# Patient Record
Sex: Female | Born: 1975 | Race: White | Hispanic: No | Marital: Married | Smoking: Never smoker
Health system: Southern US, Community
[De-identification: ages and names within clinical notes are randomized; demographics above are authoritative.]

## PROBLEM LIST (undated history)

## (undated) DIAGNOSIS — R591 Generalized enlarged lymph nodes: Principal | ICD-10-CM

## (undated) DIAGNOSIS — Z9889 Other specified postprocedural states: Secondary | ICD-10-CM

## (undated) DIAGNOSIS — R112 Nausea with vomiting, unspecified: Secondary | ICD-10-CM

## (undated) DIAGNOSIS — N390 Urinary tract infection, site not specified: Secondary | ICD-10-CM

## (undated) DIAGNOSIS — N809 Endometriosis, unspecified: Secondary | ICD-10-CM

## (undated) DIAGNOSIS — K589 Irritable bowel syndrome without diarrhea: Secondary | ICD-10-CM

## (undated) DIAGNOSIS — E538 Deficiency of other specified B group vitamins: Secondary | ICD-10-CM

## (undated) DIAGNOSIS — N83201 Unspecified ovarian cyst, right side: Secondary | ICD-10-CM

## (undated) HISTORY — PX: ABDOMINAL HYSTERECTOMY: SHX81

## (undated) HISTORY — DX: Deficiency of other specified B group vitamins: E53.8

## (undated) HISTORY — DX: Endometriosis, unspecified: N80.9

## (undated) HISTORY — PX: LAPAROSCOPY: SHX197

## (undated) HISTORY — DX: Irritable bowel syndrome, unspecified: K58.9

## (undated) HISTORY — DX: Other disorders of bilirubin metabolism: E80.6

## (undated) HISTORY — DX: Generalized enlarged lymph nodes: R59.1

## (undated) HISTORY — DX: Unspecified ovarian cyst, right side: N83.201

## (undated) HISTORY — DX: Urinary tract infection, site not specified: N39.0

---

## 1997-10-21 ENCOUNTER — Ambulatory Visit (HOSPITAL_COMMUNITY): Admission: RE | Admit: 1997-10-21 | Discharge: 1997-10-22 | Payer: Self-pay | Admitting: Gynecology

## 1999-05-29 ENCOUNTER — Other Ambulatory Visit: Admission: RE | Admit: 1999-05-29 | Discharge: 1999-05-29 | Payer: Self-pay | Admitting: Obstetrics and Gynecology

## 2001-05-09 ENCOUNTER — Other Ambulatory Visit: Admission: RE | Admit: 2001-05-09 | Discharge: 2001-05-09 | Payer: Self-pay | Admitting: Obstetrics and Gynecology

## 2001-05-29 ENCOUNTER — Encounter: Payer: Self-pay | Admitting: Internal Medicine

## 2001-05-29 ENCOUNTER — Encounter: Admission: RE | Admit: 2001-05-29 | Discharge: 2001-05-29 | Payer: Self-pay | Admitting: Internal Medicine

## 2001-12-13 ENCOUNTER — Inpatient Hospital Stay (HOSPITAL_COMMUNITY): Admission: AD | Admit: 2001-12-13 | Discharge: 2001-12-13 | Payer: Self-pay | Admitting: Obstetrics & Gynecology

## 2002-06-14 ENCOUNTER — Inpatient Hospital Stay (HOSPITAL_COMMUNITY): Admission: AD | Admit: 2002-06-14 | Discharge: 2002-06-14 | Payer: Self-pay | Admitting: Obstetrics and Gynecology

## 2002-06-24 ENCOUNTER — Inpatient Hospital Stay (HOSPITAL_COMMUNITY): Admission: AD | Admit: 2002-06-24 | Discharge: 2002-06-24 | Payer: Self-pay | Admitting: Obstetrics & Gynecology

## 2002-06-26 ENCOUNTER — Inpatient Hospital Stay (HOSPITAL_COMMUNITY): Admission: AD | Admit: 2002-06-26 | Discharge: 2002-06-28 | Payer: Self-pay | Admitting: Obstetrics and Gynecology

## 2002-06-30 ENCOUNTER — Ambulatory Visit (HOSPITAL_COMMUNITY): Admission: RE | Admit: 2002-06-30 | Discharge: 2002-06-30 | Payer: Self-pay | Admitting: *Deleted

## 2003-08-25 ENCOUNTER — Other Ambulatory Visit: Admission: RE | Admit: 2003-08-25 | Discharge: 2003-08-25 | Payer: Self-pay | Admitting: Obstetrics and Gynecology

## 2004-07-04 ENCOUNTER — Encounter (INDEPENDENT_AMBULATORY_CARE_PROVIDER_SITE_OTHER): Payer: Self-pay | Admitting: *Deleted

## 2004-07-04 ENCOUNTER — Encounter: Admission: RE | Admit: 2004-07-04 | Discharge: 2004-07-04 | Payer: Self-pay | Admitting: Internal Medicine

## 2004-07-17 ENCOUNTER — Ambulatory Visit: Payer: Self-pay | Admitting: Gastroenterology

## 2009-01-27 DIAGNOSIS — K589 Irritable bowel syndrome without diarrhea: Secondary | ICD-10-CM | POA: Insufficient documentation

## 2009-01-27 DIAGNOSIS — N809 Endometriosis, unspecified: Secondary | ICD-10-CM | POA: Insufficient documentation

## 2009-01-27 DIAGNOSIS — R17 Unspecified jaundice: Secondary | ICD-10-CM | POA: Insufficient documentation

## 2009-02-02 ENCOUNTER — Ambulatory Visit: Payer: Self-pay | Admitting: Internal Medicine

## 2009-02-04 LAB — CONVERTED CEMR LAB: Tissue Transglutaminase Ab, IgA: 0.5 units (ref <7)

## 2010-05-18 ENCOUNTER — Ambulatory Visit (HOSPITAL_COMMUNITY): Admission: RE | Admit: 2010-05-18 | Discharge: 2010-05-18 | Payer: Self-pay | Admitting: Obstetrics and Gynecology

## 2010-10-12 NOTE — Assessment & Plan Note (Signed)
Summary: IBS/ STRONG FAN HX COL CA/FH   History of Present Illness Visit Type: Initial Visit Primary GI MD: Lina Sar MD Primary Nelva Hauk: Marisue Brooklyn MD Chief Complaint: IBS no current GI complaints, has strong family history of colon and stomach cancer History of Present Illness:   This is a 35 year old white female with a history of irritable bowel syndrome and significant endometriosis for which she is status post laparoscopic pelvic surgery in 1999 with a recurrence of endometriosis. Patient has been followed by Dr.Taavon. She describes crampy abdominal pain. There are certain food intolerances but no rectal bleeding and no family history of inflammatory bowel disease. She has a strong family history of colon and gastrointestinal cancer. Her paternal grandfather had colon cancer in his 79s. His son, the patient's father has had numerous colon polyps removed. He is a patient in our practice as well. Patient's maternal grandfather had gastric cancer. Patient had a normal abdominal ultrasound in 2005. Her total bilirubin was elevated at the time but the rest of the liver tests were normal. She has questionable lactose intolerance. Her irritable bowel syndrome is controlled by dietary modifications but she has breakthrough diarrhea postprandially. She takes over-the-counter medications to control her symptoms.   GI Review of Systems      Denies abdominal pain, acid reflux, belching, bloating, chest pain, dysphagia with liquids, dysphagia with solids, heartburn, loss of appetite, nausea, vomiting, vomiting blood, weight loss, and  weight gain.      Reports irritable bowel syndrome.     Denies anal fissure, black tarry stools, change in bowel habit, constipation, diarrhea, diverticulosis, fecal incontinence, heme positive stool, hemorrhoids, jaundice, light color stool, liver problems, rectal bleeding, and  rectal pain.    Current Medications (verified): 1)  Multivitamins   Tabs (Multiple  Vitamin) .Marland Kitchen.. 1 By Mouth Once Daily  Allergies (verified): 1)  ! Phenergan 2)  ! * Latex  Past History:  Past Medical History: Reviewed history from 01/27/2009 and no changes required. Current Problems:  Hx of HYPERBILIRUBINEMIA (ICD-782.4) ENDOMETRIOSIS (ICD-617.9) Family Hx of COLON CANCER (ICD-153.9) IRRITABLE BOWEL SYNDROME (ICD-564.1)    Past Surgical History: Reviewed history from 01/27/2009 and no changes required. Laproscopy for endometriosis-1999  Family History: Family History of Breast Cancer: Paternal Aunt Family History of Colon Cancer: Paternal Grandmother, Aunt Family History of Colon Polyps: Father Family History of Heart Disease: Maternal Grandfather Family History of Stomach Cancer:grandfather  Social History: Alcohol Use - yes-socially Illicit Drug Use - no Patient has never smoked.  Daily Caffeine Use 1 per day Patient gets regular exercise. Smoking Status:  never Does Patient Exercise:  yes  Review of Systems       The patient complains of menstrual pain and muscle pains/cramps.         Pertinent positive and negative review of systems were noted in the above HPI. All other ROS was otherwise negative.   Vital Signs:  Patient profile:   35 year old female Height:      65 inches Weight:      159.50 pounds BMI:     26.64 Pulse rate:   64 / minute Pulse rhythm:   regular BP sitting:   110 / 70  (right arm)  Vitals Entered By: Chales Abrahams CMA (Feb 02, 2009 9:20 AM)  Physical Exam  General:  Well developed, well nourished, no acute distress. Eyes:  PERRLA, no icterus. Mouth:  No deformity or lesions, dentition normal. Neck:  Supple; no masses or thyromegaly. Lungs:  Clear  throughout to auscultation. Heart:  Regular rate and rhythm; no murmurs, rubs or bruits. Abdomen:  soft abdomen with normal active bowel sounds and no distention. There are some stretch marks in the lower abdomen. Liver edge is at costal margin. Spleen is not  enlarged. Rectal:  Rectal tone is normal stool is hemoccult negative. Extremities:  No clubbing, cyanosis, edema or deformities noted. Neurologic:  Alert and oriented x4; grossly normal neurologically. Skin:  no rashes   Impression & Recommendations:  Problem # 1:  Hx of HYPERBILIRUBINEMIA (ICD-782.4) We will obtain a total bilirubin, direct and indirect. 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  Orders: T- * Misc. Laboratory test [phone])  Problem # 2:  ENDOMETRIOSIS (ICD-617.9) significant pelvic endometriosis may be affecting her bowel function. She says that Dr Billy Coast may have to do  another laparoscopic surgery to control her endometriosis.  Problem # 3:  Family Hx of COLON CANCER (ICD-153.9) Patient has colon cancer in an indirect relative. There were colon polyps in her father and gastric cancer in her grandfather. She should have a colonoscopy at age 40.  Problem # 4:  IRRITABLE BOWEL SYNDROME (ICD-564.1) I suggested using Levsin sublingually 0.125 mg p.r.n. cramps and following a high fiber diet.  Other Orders: T-Tissue Transglutamase Ab IgA [phone])  Patient Instructions: 1)  Levsin 0.125 mg p.r.n., # 30  2)  High-fiber diet. 3)  Lab work today including tissue transglutaminase and total bilirubin, direct and indirect bilirubin. 4)  Recall colonoscopy at age 61 or earlier if she develops specific symptoms. 5)  Copy sent to : Dr Billy Coast, Dr Wray Kearns Prescriptions: LEVSIN/SL 0.125 MG SUBL (HYOSCYAMINE SULFATE) Dissolve 1 tablet under tongue as needed for crampy abdominal pain.  #30 x 1   Entered by:   Hortense Ramal CMA   Authorized by:   Hart Carwin   Signed by:   Hortense Ramal CMA on 02/02/2009   Method used:   Electronically to        [id number] (retail)       [address]       [place], [place]  [address]       Ph: [phone] or [phone]       Fax: [phone]   RxID:   [id number]

## 2010-10-12 NOTE — Procedures (Signed)
Summary: Gastroenterology-Office Note  Gastroenterology-Office Note   Imported By: Hortense Ramal CMA 01/28/2009 17:44:28  _____________________________________________________________________  External Attachment:    Type:   Image     Comment:   External Document

## 2010-11-23 LAB — CBC
HCT: 41.5 % (ref 36.0–46.0)
Hemoglobin: 14 g/dL (ref 12.0–15.0)
MCH: 30.3 pg (ref 26.0–34.0)
MCHC: 33.8 g/dL (ref 30.0–36.0)
MCV: 89.4 fL (ref 78.0–100.0)
Platelets: 205 10*3/uL (ref 150–400)
RBC: 4.64 MIL/uL (ref 3.87–5.11)
RDW: 13 % (ref 11.5–15.5)
WBC: 5.9 10*3/uL (ref 4.0–10.5)

## 2010-11-23 LAB — HCG, SERUM, QUALITATIVE: Preg, Serum: NEGATIVE

## 2010-11-23 LAB — SURGICAL PCR SCREEN
MRSA, PCR: NEGATIVE
Staphylococcus aureus: NEGATIVE

## 2011-01-19 ENCOUNTER — Ambulatory Visit
Admission: RE | Admit: 2011-01-19 | Discharge: 2011-01-19 | Disposition: A | Discharge status: Home / Self Care | Payer: BC Managed Care – PPO | Source: Ambulatory Visit | Attending: Internal Medicine | Admitting: Internal Medicine

## 2011-01-19 ENCOUNTER — Other Ambulatory Visit: Payer: Self-pay | Admitting: Internal Medicine

## 2011-01-19 DIAGNOSIS — R221 Localized swelling, mass and lump, neck: Secondary | ICD-10-CM

## 2011-05-09 ENCOUNTER — Other Ambulatory Visit: Payer: Self-pay | Admitting: Internal Medicine

## 2011-05-09 DIAGNOSIS — IMO0002 Reserved for concepts with insufficient information to code with codable children: Secondary | ICD-10-CM

## 2011-05-10 ENCOUNTER — Ambulatory Visit
Admission: RE | Admit: 2011-05-10 | Discharge: 2011-05-10 | Disposition: A | Discharge status: Home / Self Care | Payer: BC Managed Care – PPO | Source: Ambulatory Visit | Attending: Internal Medicine | Admitting: Internal Medicine

## 2011-05-10 DIAGNOSIS — IMO0002 Reserved for concepts with insufficient information to code with codable children: Secondary | ICD-10-CM

## 2011-05-10 MED ORDER — IOHEXOL 300 MG/ML  SOLN
75.0000 mL | Freq: Once | INTRAMUSCULAR | Status: AC | PRN
  Administered 2011-05-10: 75 mL via INTRAVENOUS

## 2012-10-06 ENCOUNTER — Telehealth: Payer: Self-pay | Admitting: Internal Medicine

## 2012-10-06 ENCOUNTER — Ambulatory Visit (INDEPENDENT_AMBULATORY_CARE_PROVIDER_SITE_OTHER): Payer: BC Managed Care – PPO | Admitting: Gastroenterology

## 2012-10-06 ENCOUNTER — Encounter: Payer: Self-pay | Admitting: Gastroenterology

## 2012-10-06 ENCOUNTER — Other Ambulatory Visit (INDEPENDENT_AMBULATORY_CARE_PROVIDER_SITE_OTHER): Payer: BC Managed Care – PPO

## 2012-10-06 VITALS — BP 138/76 | HR 80 | Ht 64.5 in | Wt 150.0 lb

## 2012-10-06 DIAGNOSIS — R143 Flatulence: Secondary | ICD-10-CM

## 2012-10-06 DIAGNOSIS — R141 Gas pain: Secondary | ICD-10-CM

## 2012-10-06 DIAGNOSIS — K589 Irritable bowel syndrome without diarrhea: Secondary | ICD-10-CM

## 2012-10-06 DIAGNOSIS — R197 Diarrhea, unspecified: Secondary | ICD-10-CM

## 2012-10-06 DIAGNOSIS — R140 Abdominal distension (gaseous): Secondary | ICD-10-CM | POA: Insufficient documentation

## 2012-10-06 DIAGNOSIS — R109 Unspecified abdominal pain: Secondary | ICD-10-CM

## 2012-10-06 LAB — COMPREHENSIVE METABOLIC PANEL
ALT: 16 U/L (ref 0–35)
AST: 16 U/L (ref 0–37)
Albumin: 4.4 g/dL (ref 3.5–5.2)
Alkaline Phosphatase: 43 U/L (ref 39–117)
BUN: 17 mg/dL (ref 6–23)
CO2: 27 mEq/L (ref 19–32)
Calcium: 9.3 mg/dL (ref 8.4–10.5)
Chloride: 104 mEq/L (ref 96–112)
Creatinine, Ser: 0.7 mg/dL (ref 0.4–1.2)
GFR: 97.68 mL/min (ref >60.00)
Glucose, Bld: 87 mg/dL (ref 70–99)
Potassium: 4.4 mEq/L (ref 3.5–5.1)
Sodium: 138 mEq/L (ref 135–145)
Total Bilirubin: 2.2 mg/dL — ABNORMAL HIGH (ref 0.3–1.2)
Total Protein: 7.4 g/dL (ref 6.0–8.3)

## 2012-10-06 LAB — CBC
HCT: 43.1 % (ref 36.0–46.0)
Hemoglobin: 14.6 g/dL (ref 12.0–15.0)
MCHC: 33.9 g/dL (ref 30.0–36.0)
MCV: 88.7 fl (ref 78.0–100.0)
Platelets: 207 10*3/uL (ref 150.0–400.0)
RBC: 4.86 Mil/uL (ref 3.87–5.11)
RDW: 12.8 % (ref 11.5–14.6)
WBC: 7.3 10*3/uL (ref 4.5–10.5)

## 2012-10-06 LAB — TSH: TSH: 2.2 u[IU]/mL (ref 0.35–5.50)

## 2012-10-06 LAB — IGA: IgA: 290 mg/dL (ref 68–378)

## 2012-10-06 NOTE — Patient Instructions (Addendum)
Your physician has requested that you go to the basement for  lab work before leaving today.   You have been scheduled for an HIDA scan with [facility] at [facility] on 10/21/2012 @ 9:45; please arrive at 9:30am, report to Radiology.  Have nothing to eat or drink after midnight prior to your scan.  If you need to reschedule this appointment please call [phone]   Please follow up with Dr. Juanda Chance in 4-6 weeks

## 2012-10-06 NOTE — Progress Notes (Signed)
Reviewed, acute diarrhea superimposed on IBS. Could be bacterial overgrowth, gastro-enteritris. Agree with ultrasound. i will see her in 6 weeks

## 2012-10-06 NOTE — Progress Notes (Signed)
10/06/2012 Brenda Bennett [id number] Apr 18, 1976   HISTORY OF PRESENT ILLNESS:  Patient is a pleasant 37 year old female who presents to our office today with complaints of diarrhea, upper abdominal pain, nausea, bloating, and an episode of vomiting that has been present since eating pasta at a restaurant on Saturday night.  She was seen in 2005 and 2010 at this practice and diagnosed with IBS.  She did not want to take any medication, therefore, she has been managing her symptoms with diet for the last 20 years since her symptoms began.  She avoids spicy foods, lactose, and most recently has significantly decreased gluten intake.  Then Saturday night she went out and deviated from her normal diet.  Since then she has experienced 8 episodes of diarrhea, nausea, one episode of vomiting, bloating, and upper abdominal pain.  She called out of work today.  She looks well.  Says that the pain feels similar to the pain that she had previously with her menstrual cycles, but she just had her first cycle in two years after having an ablation.  She is unsure if the pain is related to GI issues or her GYN issues/endometriosis, but she is also seeing her GYN next week.  Otherwise symptoms had been doing well for quite some time.  No dark or bloody stool, decreased appetite, or unintentional weight loss.  Her son was recently diagnosed with DM type 1 and celiac disease during screening (he is not symptomatic).     Past Medical History  Diagnosis Date  . IBS (irritable bowel syndrome)   . UTI (lower urinary tract infection)    Past Surgical History  Procedure Date  . Laparoscopy 2/99, 12/12    AND ABLASION    reports that she has never smoked. She has never used smokeless tobacco. She reports that she drinks alcohol. She reports that she does not use illicit drugs. family history includes Breast cancer in her paternal aunt; Celiac disease in her brother; Colon cancer (age of onset:0) in her paternal  grandmother; Colon polyps in her father; and Diabetes in her son. Allergies  Allergen Reactions  . Latex   . Promethazine Hcl       Outpatient Encounter Prescriptions as of 10/06/2012  Medication Sig Dispense Refill  . Calcium Glycerophosphate (PRELIEF) 340 (65-50) MG (CA-P) TABS Take 1 capsule by mouth as needed.         REVIEW OF SYSTEMS  : All other systems reviewed and negative except where noted in the History of Present Illness.   PHYSICAL EXAM: BP 138/76  Pulse 80  Ht 5' 4.5" (1.638 m)  Wt 150 lb (68.04 kg)  BMI 25.35 kg/m2 General: Well developed white female in no acute distress Head: Normocephalic and atraumatic Eyes:  sclerae anicteric,conjunctive pink. Ears: Normal auditory acuity Lungs: Clear throughout to auscultation Heart: Regular rate and rhythm Abdomen: Soft, non-distended. No masses or hepatomegaly noted. Normal bowel sounds.  Diffuse TTP > in the upper abdomen. Musculoskeletal: Symmetrical with no gross deformities  Skin: No lesions on visible extremities Extremities: No edema  Neurological: Alert oriented x 4, grossly nonfocal Psychological:  Alert and cooperative. Normal mood and affect  ASSESSMENT AND PLAN: -Long-standing complaints of diarrhea and abdominal pain with bloating that has been diagnosed as IBS in the past, usually controlled with diet.  "Flare" of symptoms with diarrhea, upper abdominal pain, nausea, bloating, and an episode of vomiting since eating pasta on Saturday.  Her son was recently diagnosed with celiac disease.  She may even have a gluten "sensitivity" vs celiac vs gallbladder dysfunction vs IBS (likely has IBS but may have something else overlapping).  *Will check TTG and IgA for celiac as well as TSH, CBC, and CMP. *Will rule out gallbladder etiology with ultrasound/HIDA scan. *Celiac dietary measures as she had been previously. *Does not want any medication for symptomatic treatment. *Offered stool studies but she declined at  this time; doubt an infectious source anyway. *Follow-up 4-6 weeks.

## 2012-10-06 NOTE — Telephone Encounter (Signed)
Patient has diarrhea and abdominal pain x 48 hours. Denies fever or bleeding. Hx of IBS but has been controlled by diet. States her son was recently diagnosed with celiac and she is concerned she may have it also. Scheduled with Doug Sou, PA today at 1:30 pm.

## 2012-10-21 ENCOUNTER — Encounter (HOSPITAL_COMMUNITY): Payer: Self-pay

## 2012-10-21 ENCOUNTER — Encounter (HOSPITAL_COMMUNITY)
Admission: RE | Admit: 2012-10-21 | Discharge: 2012-10-21 | Disposition: A | Discharge status: Home / Self Care | Payer: BC Managed Care – PPO | Source: Ambulatory Visit | Attending: Gastroenterology | Admitting: Gastroenterology

## 2012-10-21 DIAGNOSIS — R197 Diarrhea, unspecified: Secondary | ICD-10-CM | POA: Insufficient documentation

## 2012-10-21 DIAGNOSIS — R109 Unspecified abdominal pain: Secondary | ICD-10-CM | POA: Insufficient documentation

## 2012-10-21 MED ORDER — TECHNETIUM TC 99M MEBROFENIN IV KIT
4.9000 | PACK | Freq: Once | INTRAVENOUS | Status: AC | PRN
  Administered 2012-10-21: 4.9 via INTRAVENOUS

## 2012-10-27 ENCOUNTER — Encounter: Payer: Self-pay | Admitting: *Deleted

## 2012-10-30 ENCOUNTER — Encounter (HOSPITAL_COMMUNITY): Payer: Self-pay | Admitting: Pharmacist

## 2012-11-04 ENCOUNTER — Encounter (HOSPITAL_COMMUNITY)
Admission: RE | Admit: 2012-11-04 | Discharge: 2012-11-04 | Disposition: A | Discharge status: Home / Self Care | Payer: BC Managed Care – PPO | Source: Ambulatory Visit | Attending: Obstetrics and Gynecology | Admitting: Obstetrics and Gynecology

## 2012-11-04 ENCOUNTER — Encounter (HOSPITAL_COMMUNITY): Payer: Self-pay

## 2012-11-04 HISTORY — DX: Other specified postprocedural states: Z98.890

## 2012-11-04 HISTORY — DX: Nausea with vomiting, unspecified: R11.2

## 2012-11-04 LAB — CBC
HCT: 40.4 % (ref 36.0–46.0)
Hemoglobin: 13.6 g/dL (ref 12.0–15.0)
MCH: 29.6 pg (ref 26.0–34.0)
MCHC: 33.7 g/dL (ref 30.0–36.0)
MCV: 88 fL (ref 78.0–100.0)
Platelets: 205 10*3/uL (ref 150–400)
RBC: 4.59 MIL/uL (ref 3.87–5.11)
RDW: 12.6 % (ref 11.5–15.5)
WBC: 4.6 10*3/uL (ref 4.0–10.5)

## 2012-11-04 LAB — SURGICAL PCR SCREEN
MRSA, PCR: NEGATIVE
Staphylococcus aureus: NEGATIVE

## 2012-11-04 NOTE — Patient Instructions (Addendum)
20 NADIA VIAR  11/04/2012   Your procedure is scheduled on:  11/10/12  Enter through the Main Entrance of [facility] at 1130 AM.  Pick up the phone at the desk and dial [phone].   Call this number if you have problems the morning of surgery: [phone]   Remember:   Do not eat food:After Midnight.  Do not drink clear liquids: 6 Hours before arrival.  Take these medicines the morning of surgery with A SIP OF WATER: NA   Do not wear jewelry, make-up or nail polish.  Do not wear lotions, powders, or perfumes. You may wear deodorant.  Do not shave 48 hours prior to surgery.  Do not bring valuables to the hospital.  Contacts, dentures or bridgework may not be worn into surgery.  Leave suitcase in the car. After surgery it may be brought to your room.  For patients admitted to the hospital, checkout time is 11:00 AM the day of discharge.   Patients discharged the day of surgery will not be allowed to drive home.  Name and phone number of your driver: NA  Special Instructions: Shower using CHG 2 nights before surgery and the night before surgery.  If you shower the day of surgery use CHG.  Use special wash - you have one bottle of CHG for all showers.  You should use approximately 1/3 of the bottle for each shower.   Please read over the following fact sheets that you were given: MRSA Information

## 2012-11-06 ENCOUNTER — Other Ambulatory Visit: Payer: Self-pay | Admitting: Obstetrics and Gynecology

## 2012-11-10 ENCOUNTER — Encounter (HOSPITAL_COMMUNITY): Payer: Self-pay | Admitting: Anesthesiology

## 2012-11-10 ENCOUNTER — Encounter (HOSPITAL_COMMUNITY)
Admission: RE | Disposition: A | Discharge status: Home / Self Care | Payer: Self-pay | Source: Ambulatory Visit | Attending: Obstetrics and Gynecology

## 2012-11-10 ENCOUNTER — Ambulatory Visit (HOSPITAL_COMMUNITY)
Admission: RE | Admit: 2012-11-10 | Discharge: 2012-11-11 | Disposition: A | Discharge status: Home / Self Care | Payer: BC Managed Care – PPO | Source: Ambulatory Visit | Attending: Obstetrics and Gynecology | Admitting: Obstetrics and Gynecology

## 2012-11-10 ENCOUNTER — Ambulatory Visit (HOSPITAL_COMMUNITY): Payer: BC Managed Care – PPO | Admitting: Anesthesiology

## 2012-11-10 DIAGNOSIS — N80109 Endometriosis of ovary, unspecified side, unspecified depth: Secondary | ICD-10-CM | POA: Insufficient documentation

## 2012-11-10 DIAGNOSIS — D252 Subserosal leiomyoma of uterus: Secondary | ICD-10-CM | POA: Insufficient documentation

## 2012-11-10 DIAGNOSIS — N803 Endometriosis of pelvic peritoneum, unspecified: Secondary | ICD-10-CM | POA: Insufficient documentation

## 2012-11-10 DIAGNOSIS — D251 Intramural leiomyoma of uterus: Secondary | ICD-10-CM | POA: Insufficient documentation

## 2012-11-10 DIAGNOSIS — N801 Endometriosis of ovary: Secondary | ICD-10-CM | POA: Insufficient documentation

## 2012-11-10 DIAGNOSIS — N946 Dysmenorrhea, unspecified: Secondary | ICD-10-CM | POA: Insufficient documentation

## 2012-11-10 DIAGNOSIS — N809 Endometriosis, unspecified: Secondary | ICD-10-CM | POA: Diagnosis present

## 2012-11-10 DIAGNOSIS — N8000 Endometriosis of the uterus, unspecified: Secondary | ICD-10-CM | POA: Insufficient documentation

## 2012-11-10 DIAGNOSIS — N815 Vaginal enterocele: Secondary | ICD-10-CM | POA: Insufficient documentation

## 2012-11-10 DIAGNOSIS — Z8742 Personal history of other diseases of the female genital tract: Secondary | ICD-10-CM

## 2012-11-10 DIAGNOSIS — N949 Unspecified condition associated with female genital organs and menstrual cycle: Secondary | ICD-10-CM | POA: Insufficient documentation

## 2012-11-10 HISTORY — PX: ROBOTIC ASSISTED TOTAL HYSTERECTOMY: SHX6085

## 2012-11-10 HISTORY — PX: BILATERAL SALPINGECTOMY: SHX5743

## 2012-11-10 LAB — CBC
HCT: 38.8 % (ref 36.0–46.0)
Hemoglobin: 13.4 g/dL (ref 12.0–15.0)
MCH: 30.4 pg (ref 26.0–34.0)
MCHC: 34.5 g/dL (ref 30.0–36.0)
MCV: 88 fL (ref 78.0–100.0)
Platelets: 150 10*3/uL (ref 150–400)
RBC: 4.41 MIL/uL (ref 3.87–5.11)
RDW: 12.5 % (ref 11.5–15.5)
WBC: 10 10*3/uL (ref 4.0–10.5)

## 2012-11-10 LAB — COMPREHENSIVE METABOLIC PANEL
ALT: 17 U/L (ref 0–35)
AST: 19 U/L (ref 0–37)
Albumin: 4.5 g/dL (ref 3.5–5.2)
Alkaline Phosphatase: 43 U/L (ref 39–117)
BUN: 12 mg/dL (ref 6–23)
CO2: 25 mEq/L (ref 19–32)
Calcium: 9.7 mg/dL (ref 8.4–10.5)
Chloride: 100 mEq/L (ref 96–112)
Creatinine, Ser: 0.83 mg/dL (ref 0.50–1.10)
GFR calc Af Amer: >90 mL/min (ref >90)
GFR calc non Af Amer: 88 mL/min — ABNORMAL LOW (ref >90)
Glucose, Bld: 94 mg/dL (ref 70–99)
Potassium: 4.2 mEq/L (ref 3.5–5.1)
Sodium: 138 mEq/L (ref 135–145)
Total Bilirubin: 3.8 mg/dL — ABNORMAL HIGH (ref 0.3–1.2)
Total Protein: 7.8 g/dL (ref 6.0–8.3)

## 2012-11-10 LAB — BASIC METABOLIC PANEL
BUN: 13 mg/dL (ref 6–23)
CO2: 24 mEq/L (ref 19–32)
Calcium: 8.8 mg/dL (ref 8.4–10.5)
Chloride: 100 mEq/L (ref 96–112)
Creatinine, Ser: 0.77 mg/dL (ref 0.50–1.10)
GFR calc Af Amer: >90 mL/min (ref >90)
GFR calc non Af Amer: >90 mL/min (ref >90)
Glucose, Bld: 116 mg/dL — ABNORMAL HIGH (ref 70–99)
Potassium: 3.9 mEq/L (ref 3.5–5.1)
Sodium: 135 mEq/L (ref 135–145)

## 2012-11-10 LAB — HCG, SERUM, QUALITATIVE: Preg, Serum: NEGATIVE

## 2012-11-10 SURGERY — ROBOTIC ASSISTED TOTAL HYSTERECTOMY
Anesthesia: General

## 2012-11-10 MED ORDER — OXYCODONE-ACETAMINOPHEN 5-325 MG PO TABS
1.0000 | ORAL_TABLET | ORAL | Status: DC | PRN
  Administered 2012-11-10 – 2012-11-11 (×3): 1 via ORAL
  Filled 2012-11-10 (×3): qty 1

## 2012-11-10 MED ORDER — NEOSTIGMINE METHYLSULFATE 1 MG/ML IJ SOLN
INTRAMUSCULAR | Status: DC | PRN
  Administered 2012-11-10: 3 mg via INTRAVENOUS

## 2012-11-10 MED ORDER — GLYCOPYRROLATE 0.2 MG/ML IJ SOLN
INTRAMUSCULAR | Status: AC
  Filled 2012-11-10: qty 2

## 2012-11-10 MED ORDER — ZOLPIDEM TARTRATE 5 MG PO TABS: 5.0000 mg | ORAL_TABLET | Freq: Every evening | ORAL | Status: DC | PRN

## 2012-11-10 MED ORDER — FENTANYL CITRATE 0.05 MG/ML IJ SOLN
25.0000 ug | INTRAMUSCULAR | Status: DC | PRN
  Administered 2012-11-10 (×2): 50 ug via INTRAVENOUS

## 2012-11-10 MED ORDER — LACTATED RINGERS IR SOLN
Status: DC | PRN
  Administered 2012-11-10: 1

## 2012-11-10 MED ORDER — KETOROLAC TROMETHAMINE 30 MG/ML IJ SOLN
INTRAMUSCULAR | Status: AC
  Filled 2012-11-10: qty 1

## 2012-11-10 MED ORDER — NEOSTIGMINE METHYLSULFATE 1 MG/ML IJ SOLN
INTRAMUSCULAR | Status: AC
  Filled 2012-11-10: qty 1

## 2012-11-10 MED ORDER — MIDAZOLAM HCL 5 MG/5ML IJ SOLN
INTRAMUSCULAR | Status: DC | PRN
  Administered 2012-11-10: 2 mg via INTRAVENOUS

## 2012-11-10 MED ORDER — PROPOFOL 10 MG/ML IV EMUL
INTRAVENOUS | Status: DC | PRN
  Administered 2012-11-10: 180 mg via INTRAVENOUS

## 2012-11-10 MED ORDER — FENTANYL CITRATE 0.05 MG/ML IJ SOLN
INTRAMUSCULAR | Status: AC
  Filled 2012-11-10: qty 5

## 2012-11-10 MED ORDER — GLYCOPYRROLATE 0.2 MG/ML IJ SOLN
INTRAMUSCULAR | Status: AC
  Filled 2012-11-10: qty 1

## 2012-11-10 MED ORDER — ACETAMINOPHEN 10 MG/ML IV SOLN
INTRAVENOUS | Status: AC
  Administered 2012-11-10: 1000 mg via INTRAVENOUS
  Filled 2012-11-10: qty 100

## 2012-11-10 MED ORDER — ROCURONIUM BROMIDE 100 MG/10ML IV SOLN
INTRAVENOUS | Status: DC | PRN
  Administered 2012-11-10: 5 mg via INTRAVENOUS
  Administered 2012-11-10: 45 mg via INTRAVENOUS
  Administered 2012-11-10: 5 mg via INTRAVENOUS

## 2012-11-10 MED ORDER — LIDOCAINE HCL (CARDIAC) 20 MG/ML IV SOLN
INTRAVENOUS | Status: DC | PRN
  Administered 2012-11-10: 50 mg via INTRAVENOUS

## 2012-11-10 MED ORDER — GLYCOPYRROLATE 0.2 MG/ML IJ SOLN
INTRAMUSCULAR | Status: DC | PRN
  Administered 2012-11-10: 0.1 mg via INTRAVENOUS
  Administered 2012-11-10: 0.4 mg via INTRAVENOUS
  Administered 2012-11-10: 0.1 mg via INTRAVENOUS

## 2012-11-10 MED ORDER — ONDANSETRON HCL 4 MG/2ML IJ SOLN
INTRAMUSCULAR | Status: DC | PRN
  Administered 2012-11-10: 4 mg via INTRAVENOUS

## 2012-11-10 MED ORDER — MEPERIDINE HCL 25 MG/ML IJ SOLN: 6.2500 mg | INTRAMUSCULAR | Status: DC | PRN

## 2012-11-10 MED ORDER — KETOROLAC TROMETHAMINE 30 MG/ML IJ SOLN
INTRAMUSCULAR | Status: DC | PRN
  Administered 2012-11-10: 30 mg via INTRAVENOUS

## 2012-11-10 MED ORDER — LIDOCAINE HCL (CARDIAC) 20 MG/ML IV SOLN
INTRAVENOUS | Status: AC
  Filled 2012-11-10: qty 5

## 2012-11-10 MED ORDER — CEFAZOLIN SODIUM-DEXTROSE 2-3 GM-% IV SOLR
2.0000 g | INTRAVENOUS | Status: AC
  Administered 2012-11-10: 2 g via INTRAVENOUS

## 2012-11-10 MED ORDER — DEXAMETHASONE SODIUM PHOSPHATE 10 MG/ML IJ SOLN
INTRAMUSCULAR | Status: AC
  Filled 2012-11-10: qty 1

## 2012-11-10 MED ORDER — SCOPOLAMINE 1 MG/3DAYS TD PT72
MEDICATED_PATCH | TRANSDERMAL | Status: AC
  Administered 2012-11-10: 1.5 mg via TRANSDERMAL
  Filled 2012-11-10: qty 1

## 2012-11-10 MED ORDER — ROCURONIUM BROMIDE 50 MG/5ML IV SOLN
INTRAVENOUS | Status: AC
  Filled 2012-11-10: qty 1

## 2012-11-10 MED ORDER — OXYCODONE-ACETAMINOPHEN 5-325 MG PO TABS: 1.0000 | ORAL_TABLET | ORAL | Status: DC | PRN

## 2012-11-10 MED ORDER — LACTATED RINGERS IV SOLN: INTRAVENOUS | Status: DC

## 2012-11-10 MED ORDER — TRAMADOL HCL 50 MG PO TABS: 50.0000 mg | ORAL_TABLET | Freq: Four times a day (QID) | ORAL | Status: DC | PRN

## 2012-11-10 MED ORDER — ARTIFICIAL TEARS OP OINT
TOPICAL_OINTMENT | OPHTHALMIC | Status: AC
  Filled 2012-11-10: qty 3.5

## 2012-11-10 MED ORDER — ONDANSETRON HCL 4 MG/2ML IJ SOLN
INTRAMUSCULAR | Status: AC
  Filled 2012-11-10: qty 2

## 2012-11-10 MED ORDER — LACTATED RINGERS IV SOLN
INTRAVENOUS | Status: DC
  Administered 2012-11-10 (×4): via INTRAVENOUS

## 2012-11-10 MED ORDER — ROPIVACAINE HCL 5 MG/ML IJ SOLN
INTRAMUSCULAR | Status: DC | PRN
  Administered 2012-11-10: 30 mL via EPIDURAL

## 2012-11-10 MED ORDER — SCOPOLAMINE 1 MG/3DAYS TD PT72: 1.0000 | MEDICATED_PATCH | TRANSDERMAL | Status: DC

## 2012-11-10 MED ORDER — DEXAMETHASONE SODIUM PHOSPHATE 10 MG/ML IJ SOLN
INTRAMUSCULAR | Status: DC | PRN
  Administered 2012-11-10: 10 mg via INTRAVENOUS

## 2012-11-10 MED ORDER — MIDAZOLAM HCL 2 MG/2ML IJ SOLN
INTRAMUSCULAR | Status: AC
  Filled 2012-11-10: qty 2

## 2012-11-10 MED ORDER — METOCLOPRAMIDE HCL 5 MG/ML IJ SOLN: 10.0000 mg | Freq: Once | INTRAMUSCULAR | Status: DC | PRN

## 2012-11-10 MED ORDER — PROPOFOL 10 MG/ML IV EMUL
INTRAVENOUS | Status: AC
  Filled 2012-11-10: qty 20

## 2012-11-10 MED ORDER — FENTANYL CITRATE 0.05 MG/ML IJ SOLN
INTRAMUSCULAR | Status: AC
  Administered 2012-11-10: 50 ug via INTRAVENOUS
  Filled 2012-11-10: qty 2

## 2012-11-10 MED ORDER — SCOPOLAMINE 1 MG/3DAYS TD PT72
1.0000 | MEDICATED_PATCH | TRANSDERMAL | Status: DC
Start: 2012-11-10 — End: 2012-11-10

## 2012-11-10 MED ORDER — FENTANYL CITRATE 0.05 MG/ML IJ SOLN
INTRAMUSCULAR | Status: AC
  Filled 2012-11-10: qty 2

## 2012-11-10 MED ORDER — ROPIVACAINE HCL 5 MG/ML IJ SOLN
INTRAMUSCULAR | Status: AC
  Filled 2012-11-10: qty 60

## 2012-11-10 MED ORDER — FENTANYL CITRATE 0.05 MG/ML IJ SOLN
INTRAMUSCULAR | Status: DC | PRN
  Administered 2012-11-10 (×2): 50 ug via INTRAVENOUS
  Administered 2012-11-10: 100 ug via INTRAVENOUS

## 2012-11-10 SURGICAL SUPPLY — 68 items
ADH SKN CLS APL DERMABOND .7 (GAUZE/BANDAGES/DRESSINGS) ×2
BAG URINE DRAINAGE (UROLOGICAL SUPPLIES) ×3 IMPLANT
BARRIER ADHS 3X4 INTERCEED (GAUZE/BANDAGES/DRESSINGS) ×3 IMPLANT
BRR ADH 4X3 ABS CNTRL BYND (GAUZE/BANDAGES/DRESSINGS) ×2
CATH FOLEY 3WAY  5CC 16FR (CATHETERS) ×2
CATH FOLEY 3WAY 5CC 16FR (CATHETERS) ×2 IMPLANT
CHLORAPREP W/TINT 26ML (MISCELLANEOUS) ×3 IMPLANT
CLOTH BEACON ORANGE TIMEOUT ST (SAFETY) ×3 IMPLANT
CONT PATH 16OZ SNAP LID 3702 (MISCELLANEOUS) ×3 IMPLANT
COVER MAYO STAND STRL (DRAPES) ×3 IMPLANT
COVER TABLE BACK 60X90 (DRAPES) ×6 IMPLANT
COVER TIP SHEARS 8 DVNC (MISCELLANEOUS) ×2 IMPLANT
COVER TIP SHEARS 8MM DA VINCI (MISCELLANEOUS) ×1
DECANTER SPIKE VIAL GLASS SM (MISCELLANEOUS) ×3 IMPLANT
DERMABOND ADVANCED (GAUZE/BANDAGES/DRESSINGS) ×1
DERMABOND ADVANCED .7 DNX12 (GAUZE/BANDAGES/DRESSINGS) ×2 IMPLANT
DRAPE HUG U DISPOSABLE (DRAPE) ×3 IMPLANT
DRAPE LG THREE QUARTER DISP (DRAPES) ×6 IMPLANT
DRAPE WARM FLUID 44X44 (DRAPE) ×3 IMPLANT
ELECT REM PT RETURN 9FT ADLT (ELECTROSURGICAL) ×3
ELECTRODE REM PT RTRN 9FT ADLT (ELECTROSURGICAL) ×2 IMPLANT
EVACUATOR SMOKE 8.L (FILTER) ×3 IMPLANT
GAUZE VASELINE 3X9 (GAUZE/BANDAGES/DRESSINGS) IMPLANT
GLOVE BIO SURGEON STRL SZ 6.5 (GLOVE) ×1 IMPLANT
GLOVE BIO SURGEON STRL SZ7.5 (GLOVE) ×7 IMPLANT
GLOVE INDICATOR 7.0 STRL GRN (GLOVE) ×1 IMPLANT
GLOVE INDICATOR 7.5 STRL GRN (GLOVE) ×6 IMPLANT
GOWN STRL REIN XL XLG (GOWN DISPOSABLE) ×18 IMPLANT
GYRUS RUMI II 2.5CM BLUE (DISPOSABLE)
GYRUS RUMI II 3.5CM BLUE (DISPOSABLE) ×3
GYRUS RUMI II 4.0CM BLUE (DISPOSABLE)
HEMOSTAT SURGICEL 2X14 (HEMOSTASIS) ×1 IMPLANT
KIT ACCESSORY DA VINCI DISP (KITS) ×1
KIT ACCESSORY DVNC DISP (KITS) ×2 IMPLANT
LEGGING LITHOTOMY PAIR STRL (DRAPES) ×3 IMPLANT
NEEDLE INSUFFLATION 120MM (ENDOMECHANICALS) ×3 IMPLANT
PACK LAVH (CUSTOM PROCEDURE TRAY) ×3 IMPLANT
PAD PREP 24X48 CUFFED NSTRL (MISCELLANEOUS) ×6 IMPLANT
PLUG CATH AND CAP STER (CATHETERS) ×3 IMPLANT
PROTECTOR NERVE ULNAR (MISCELLANEOUS) ×6 IMPLANT
RUMI II 3.0CM BLUE KOH-EFFICIE (DISPOSABLE) IMPLANT
RUMI II GYRUS 2.5CM BLUE (DISPOSABLE) IMPLANT
RUMI II GYRUS 3.5CM BLUE (DISPOSABLE) IMPLANT
RUMI II GYRUS 4.0CM BLUE (DISPOSABLE) IMPLANT
SET CYSTO W/LG BORE CLAMP LF (SET/KITS/TRAYS/PACK) IMPLANT
SET IRRIG TUBING LAPAROSCOPIC (IRRIGATION / IRRIGATOR) ×3 IMPLANT
SOLUTION ELECTROLUBE (MISCELLANEOUS) ×3 IMPLANT
SUT VIC AB 0 CT1 27 (SUTURE) ×6
SUT VIC AB 0 CT1 27XBRD ANBCTR (SUTURE) ×4 IMPLANT
SUT VIC AB 0 CT1 27XBRD ANTBC (SUTURE) IMPLANT
SUT VICRYL 0 UR6 27IN ABS (SUTURE) ×3 IMPLANT
SUT VICRYL RAPIDE 4/0 PS 2 (SUTURE) ×6 IMPLANT
SUT VLOC 180 0 9IN  GS21 (SUTURE) ×1
SUT VLOC 180 0 9IN GS21 (SUTURE) IMPLANT
SYR 50ML LL SCALE MARK (SYRINGE) ×3 IMPLANT
SYRINGE 10CC LL (SYRINGE) ×3 IMPLANT
SYSTEM CONVERTIBLE TROCAR (TROCAR) IMPLANT
TIP UTERINE 6.7X8CM BLUE DISP (MISCELLANEOUS) ×1 IMPLANT
TOWEL OR 17X24 6PK STRL BLUE (TOWEL DISPOSABLE) ×9 IMPLANT
TROCAR BLADELESS OPT 12M 100M (ENDOMECHANICALS) IMPLANT
TROCAR DILATING TIP 12MM 150MM (ENDOMECHANICALS) ×3 IMPLANT
TROCAR DISP BLADELESS 8 DVNC (TROCAR) ×2 IMPLANT
TROCAR DISP BLADELESS 8MM (TROCAR) ×1
TROCAR XCEL 12X100 BLDLESS (ENDOMECHANICALS) IMPLANT
TROCAR XCEL NON-BLD 5MMX100MML (ENDOMECHANICALS) ×3 IMPLANT
TUBING FILTER THERMOFLATOR (ELECTROSURGICAL) ×3 IMPLANT
WARMER LAPAROSCOPE (MISCELLANEOUS) ×3 IMPLANT
WATER STERILE IRR 1000ML POUR (IV SOLUTION) ×9 IMPLANT

## 2012-11-10 NOTE — Transfer of Care (Signed)
Immediate Anesthesia Transfer of Care Note  Patient: Brenda Bennett  Procedure(s) Performed: Procedure(s) with comments: ROBOTIC ASSISTED TOTAL HYSTERECTOMY (N/A) - 3 hrs. BILATERAL SALPINGECTOMY (Bilateral)  Patient Location: PACU  Anesthesia Type:General  Level of Consciousness: awake  Airway & Oxygen Therapy: Patient Spontanous Breathing  Post-op Assessment: Report given to PACU RN  Post vital signs: stable  Filed Vitals:   11/10/12 1117  BP: 136/86  Pulse: 59  Temp: 36.7 C  Resp: 18    Complications: No apparent anesthesia complications

## 2012-11-10 NOTE — Op Note (Signed)
11/10/2012  3:43 PM  PATIENT:  Brenda Bennett  38 y.o. female  PRE-OPERATIVE DIAGNOSIS:  Endometriosis  58571  POST-OPERATIVE DIAGNOSIS:  Endometriosis  58571 Pelvic adhesions Enterocele Left Endometrioma  PROCEDURE:  Procedure(s): ROBOTIC ASSISTED TOTAL HYSTERECTOMY BILATERAL SALPINGECTOMY Excision of left Endometrioma Lysis of Adhesions Mccall Cul-de-plasty Ablation of Endometriosis  SURGEON:  Surgeon(s): Lenoard Aden, MD Alphonsus Sias. Ernestina Penna, MD  ASSISTANTSErnestina Penna   ANESTHESIA:   local and general  ESTIMATED BLOOD LOSS: * No blood loss amount entered *   DRAINS: Urinary Catheter (Foley)   LOCAL MEDICATIONS USED:  MARCAINE     SPECIMEN:  Source of Specimen:  uterus and cervix and bilateral tubes  DISPOSITION OF SPECIMEN:  PATHOLOGY  COUNTS:  YES  DICTATION #: [id number]  PLAN OF CARE: DC home  PATIENT DISPOSITION:  PACU - hemodynamically stable.

## 2012-11-10 NOTE — Anesthesia Preprocedure Evaluation (Signed)
Anesthesia Evaluation  Patient identified by MRN, date of birth, ID band Patient awake    Reviewed: Allergy & Precautions, H&P , NPO status , Patient's Chart, lab work & pertinent test results  History of Anesthesia Complications (+) PONV  Airway Mallampati: II TM Distance: >3 FB     Dental no notable dental hx. (+) Teeth Intact   Pulmonary neg pulmonary ROS,  breath sounds clear to auscultation  Pulmonary exam normal       Cardiovascular negative cardio ROS  Rhythm:Regular Rate:Normal     Neuro/Psych negative neurological ROS  negative psych ROS   GI/Hepatic negative GI ROS, Neg liver ROS,   Endo/Other  negative endocrine ROS  Renal/GU negative Renal ROS  negative genitourinary   Musculoskeletal negative musculoskeletal ROS (+)   Abdominal Normal abdominal exam  (+)   Peds  Hematology negative hematology ROS (+)   Anesthesia Other Findings   Reproductive/Obstetrics Endometriosis  Pelvic Pain                           Anesthesia Physical Anesthesia Plan  ASA: I  Anesthesia Plan: General   Post-op Pain Management:    Induction: Intravenous  Airway Management Planned: Oral ETT  Additional Equipment:   Intra-op Plan:   Post-operative Plan: Extubation in OR  Informed Consent: I have reviewed the patients History and Physical, chart, labs and discussed the procedure including the risks, benefits and alternatives for the proposed anesthesia with the patient or authorized representative who has indicated his/her understanding and acceptance.   Dental advisory given  Plan Discussed with: CRNA, Anesthesiologist and Surgeon  Anesthesia Plan Comments:         Anesthesia Quick Evaluation

## 2012-11-10 NOTE — Progress Notes (Signed)
Patient ID: Brenda Bennett, female   DOB: 22-Jun-1976, 37 y.o.   MRN: [id number] Patient seen and examined. Consent witnessed and signed. No changes noted. Update completed.

## 2012-11-10 NOTE — Anesthesia Postprocedure Evaluation (Signed)
Anesthesia Post Note  Patient: Brenda Bennett  Procedure(s) Performed: Procedure(s) (LRB): ROBOTIC ASSISTED TOTAL HYSTERECTOMY (N/A) BILATERAL SALPINGECTOMY (Bilateral)  Anesthesia type: General  Patient location: PACU  Post pain: Pain level controlled  Post assessment: Post-op Vital signs reviewed  Last Vitals:  Filed Vitals:   11/10/12 1600  BP: 112/62  Pulse: 64  Temp: 37.3 C  Resp: 22    Post vital signs: Reviewed  Level of consciousness: sedated  Complications: No apparent anesthesia complications

## 2012-11-10 NOTE — Op Note (Signed)
Brenda Bennett, Brenda Bennett              ACCOUNT NO.:  [id number]  MEDICAL RECORD NO.:  [id number]  LOCATION:  9320                          FACILITY:  [facility]  PHYSICIAN:  Lenoard Aden, M.D.DATE OF BIRTH:  Mar 09, 1976  DATE OF PROCEDURE: DATE OF DISCHARGE:                              OPERATIVE REPORT   DESCRIPTION OF PROCEDURE:  After being apprised of risks of anesthesia, infection, bleeding, injury to abdominal organs, possible need for repair, the patient was brought to the operating room where she was administered general anesthetic without complications.  She was prepped and draped in usual sterile fashion.  Foley catheter was placed. Infraumbilical incision was made with the scalpel.  Veress needle was placed, opening pressure of -2, 3 liters of CO2 was insufflated without difficulty.  Atraumatic trocar entry.  Laparoscope was placed. Visualization was used to assist in the placement of the RUMI retractor due to previous history of ablation and severe uterine retroversion. This was done atraumatically with placement of the RUMI retractor without difficulty.  At this time, attention was further and Foley catheter was placed.  At this time, attention was turned to the abdominal portion of the procedure whereby robotic ports were placed, 2 on the right, 1 on the left and a 5-mm assistant port on the left.  All trocars were placed atraumatically under direct visualization and the robot was docked in a standard fashion.  Visualization reveals a severely retroverted uterus.  A left pelvic endometrioma with adherent into the left ovarian fossa, right surface ovarian endometriosis, bilateral normal tubes, normal anterior and posterior cul-de-sac. Robotic instruments were then entered after achieving steep Trendelenburg before the docking of the robot.  At this time, Endo Prescott Parma were used to ablate the surface endometriosis along the right ovary.  At this time, the retroperitoneal space  was entered on the left. The ureter was identified along the medial leaf of the peritoneum and mobilized.  The ovary was adherent into this ovarian fossa, it was sharply dissected out.  Endometrioma was identified, opened, excised and the chest pocket was further ablated revealing good removal of this left ovarian endometrioma.  At this time, the left tube was dissected along the mesosalpinx and divided.  The left ovarian ligament was cauterized and divided.  The left round ligament was cauterized and divided.  The bladder flap was developed sharply.  The uterine vessels were skeletonized and the left cauterized, but not divided on the right side. The right mesosalpinx was cauterized freeing up the right tube.  The retroperitoneal space was entered.  The ureter was identified.  The right ovarian ligament was cauterized and divided.  The round ligament was cauterized and divided and the bladder flap was further developed on the right for complete development and the RUMI cup was palpable through the cuff.  The uterine vessels on the right were skeletonized, cauterized, and divided.  The vessels on the left were then cauterized and divided.  The Endo Shears were used to circumferentially identified the RUMI cup and the cervical vaginal junction was developed and entered.  The specimen was completely detached and retracted into the vagina.  The balloon was then placed in the vagina to maintain the  pneumoperitoneum.  The vaginal cuff was closed using a 0 Vicryl V-Loc suture in a continuous running fashion.  Plication of the uterosacral ligaments was performed and McCall culdoplasty was performed using the V- Loc sutures as well.  Good hemostasis was noted.  Ureters were re- identified and found to be peristalsing normally bilaterally.  At this time, the CO2 was released and there was an area of bleeding along the left pelvic sidewall.  Kleppinger bipolar cautery was used because the robot had  been undocked and she was to cauterize a small bleeder along the left pelvic sidewall lateral to the ureter at this time.  Surgicel was then placed in this area as well.  Good hemostasis was achieved. All instruments were then removed under direct visualization.  CO2 was released.  Incisions were closed using 0 Vicryl, 4-0 Vicryl, and Dermabond.  The patient tolerated the procedure well, was awakened, and transferred to recovery in good condition.     Lenoard Aden, M.D.     RJT/MEDQ  D:  11/10/2012  T:  11/10/2012  Job:  [id number]

## 2012-11-10 NOTE — H&P (Signed)
NAMEANTONEA, GAUT NO.:  [id number]  MEDICAL RECORD NO.:  [id number]  LOCATION:                                 FACILITY:  PHYSICIAN:  Lenoard Aden, M.D.DATE OF BIRTH:  May 23, 1976  DATE OF ADMISSION:  11/10/2012 DATE OF DISCHARGE:                             HISTORY & PHYSICAL   CHIEF COMPLAINT:  Symptomatic pelvic pain, dysmenorrhea with known endometriosis.  HISTORY OF PRESENT ILLNESS:  She is a 37 year old white female, G2, P2, with a history pelvic endometriosis on previous laparoscopies done, history of NovaSure ablation, history of most recent laparoscopy with ablation of endometriosis in 2011, who presents now for definitive therapy.  She has history of 2 previous vaginal deliveries.  She is a nonsmoker and nondrinker.  She denies domestic or physical violence.  ALLERGIES:  She has an allergy history to latex, Phenergan.  MEDICATIONS:  None.  FAMILY HISTORY:  Diabetes, breast cancer, ovarian cancer, heart disease, endometriosis, seizure disorder, hypertension, thyroid dysfunction, lung cancer, and anemia.  PREVIOUS SURGICAL HISTORY:  As noted for hysteroscopy, laparoscopy, and NovaSure ablation.  PHYSICAL EXAMINATION:  GENERAL:  She is a well-developed, well-nourished white female in no acute distress. HEENT:  Normal. NECK:  Supple.  Full range of motion. LUNGS:  Clear. HEART:  Regular rate and rhythm. ABDOMEN:  Soft, nontender. GU:  Pelvic exam reveals retroflexed uterus and no adnexal masses. EXTREMITIES:  There are no cords. NEUROLOGIC:  Nonfocal. SKIN:  Intact.  IMPRESSION:  Symptomatic dysmenorrhea and pelvic pain with a known history of endometriosis.  PLAN:  Proceed with definitive therapy.  Proceed with da Vinci assisted total laparoscopic hysterectomy, bilateral salpingectomy.  Risks of anesthesia, infection, bleeding, injury to abdominal organs, need for repair discussed.  Delayed versus immediate complications to  include bowel and bladder injury noted.  The patient acknowledges and wishes to proceed.     Lenoard Aden, M.D.     RJT/MEDQ  D:  11/09/2012  T:  11/09/2012  Job:  [id number]

## 2012-11-11 ENCOUNTER — Encounter (HOSPITAL_COMMUNITY): Payer: Self-pay | Admitting: Obstetrics and Gynecology

## 2012-11-11 DIAGNOSIS — Z8742 Personal history of other diseases of the female genital tract: Secondary | ICD-10-CM

## 2012-11-11 DIAGNOSIS — N809 Endometriosis, unspecified: Secondary | ICD-10-CM | POA: Diagnosis present

## 2012-11-11 MED ORDER — OXYCODONE-ACETAMINOPHEN 5-325 MG PO TABS: 1.0000 | ORAL_TABLET | ORAL | Status: DC | PRN

## 2012-11-11 MED ORDER — TRAMADOL HCL 50 MG PO TABS: 50.0000 mg | ORAL_TABLET | Freq: Four times a day (QID) | ORAL | Status: DC | PRN

## 2012-11-11 NOTE — Progress Notes (Signed)
1 Day Post-Op Procedure(s) (LRB): ROBOTIC ASSISTED TOTAL HYSTERECTOMY (N/A) BILATERAL SALPINGECTOMY (Bilateral)  Subjective: Patient reports nausea, incisional pain, tolerating PO, + flatus and no problems voiding.    Objective: BP 100/65  Pulse 69  Temp(Src) 97.9 F (36.6 C) (Oral)  Resp 18  Ht 5' 4.5" (1.638 m)  Wt 67.132 kg (148 lb)  BMI 25.02 kg/m2  SpO2 99%  CBC    Component Value Date/Time   WBC 10.0 11/10/2012 1859   RBC 4.41 11/10/2012 1859   HGB 13.4 11/10/2012 1859   HCT 38.8 11/10/2012 1859   PLT 150 11/10/2012 1859   MCV 88.0 11/10/2012 1859   MCH 30.4 11/10/2012 1859   MCHC 34.5 11/10/2012 1859   RDW 12.5 11/10/2012 1859     I have reviewed patient's vital signs, intake and output, medications and labs.  General: alert, cooperative and appears stated age Resp: clear to auscultation bilaterally and normal percussion bilaterally Cardio: regular rate and rhythm, S1, S2 normal, no murmur, click, rub or gallop and normal apical impulse GI: soft, non-tender; bowel sounds normal; no masses,  no organomegaly, normal findings: aorta normal and bowel sounds normal and incision: clean, dry and intact Extremities: extremities normal, atraumatic, no cyanosis or edema and Homans sign is negative, no sign of DVT Vaginal Bleeding: minimal  Assessment: s/p Procedure(s) with comments: ROBOTIC ASSISTED TOTAL HYSTERECTOMY (N/A) - 3 hrs. BILATERAL SALPINGECTOMY (Bilateral): stable, progressing well and tolerating diet  Plan: Advance diet Encourage ambulation Advance to PO medication Discontinue IV fluids Discharge home Tramadol and Percocet rx given Fu office 2 weeks.  LOS: 1 day    TAAVON,RICHARD J 11/11/2012, 7:20 AM

## 2012-11-11 NOTE — Progress Notes (Signed)
Pt d/c home out in wheelchair  Teaching complete

## 2012-11-11 NOTE — Anesthesia Postprocedure Evaluation (Signed)
Anesthesia Post Note  Patient: Brenda Bennett  Procedure(s) Performed: Procedure(s) (LRB): ROBOTIC ASSISTED TOTAL HYSTERECTOMY (N/A) BILATERAL SALPINGECTOMY (Bilateral)  Anesthesia type: General  Patient location: Mother/Baby  Post pain: Pain level controlled  Post assessment: Post-op Vital signs reviewed  Last Vitals:  Filed Vitals:   11/11/12 0550  BP: 100/65  Pulse: 69  Temp: 36.6 C  Resp: 18    Post vital signs: Reviewed  Level of consciousness: awake and alert   Complications: Patient c/o poor visual reading close up. Able to read clock on wall. Dr. Malen Gauze contacted, advised to remove scopalmine patch and reassure patient.

## 2012-11-19 ENCOUNTER — Ambulatory Visit
Admission: RE | Admit: 2012-11-19 | Discharge: 2012-11-19 | Disposition: A | Discharge status: Home / Self Care | Payer: BC Managed Care – PPO | Source: Ambulatory Visit | Attending: Obstetrics and Gynecology | Admitting: Obstetrics and Gynecology

## 2012-11-19 ENCOUNTER — Other Ambulatory Visit: Payer: Self-pay | Admitting: Obstetrics and Gynecology

## 2012-11-19 DIAGNOSIS — R109 Unspecified abdominal pain: Secondary | ICD-10-CM

## 2012-11-19 MED ORDER — IOHEXOL 300 MG/ML  SOLN
100.0000 mL | Freq: Once | INTRAMUSCULAR | Status: AC | PRN
  Administered 2012-11-19: 100 mL via INTRAVENOUS

## 2012-11-19 MED ORDER — IOHEXOL 300 MG/ML  SOLN
40.0000 mL | Freq: Once | INTRAMUSCULAR | Status: AC | PRN
  Administered 2012-11-19: 40 mL via ORAL

## 2012-11-21 ENCOUNTER — Ambulatory Visit (INDEPENDENT_AMBULATORY_CARE_PROVIDER_SITE_OTHER): Payer: BC Managed Care – PPO | Admitting: Internal Medicine

## 2012-11-21 ENCOUNTER — Encounter: Payer: Self-pay | Admitting: Internal Medicine

## 2012-11-21 VITALS — BP 112/64 | HR 76 | Ht 64.75 in | Wt 147.2 lb

## 2012-11-21 DIAGNOSIS — R1032 Left lower quadrant pain: Secondary | ICD-10-CM

## 2012-11-23 NOTE — Progress Notes (Signed)
Pt left before being seen. She had to pick up her child. Will reschedule.

## 2012-12-23 ENCOUNTER — Ambulatory Visit: Payer: BC Managed Care – PPO | Admitting: Internal Medicine

## 2013-04-21 ENCOUNTER — Other Ambulatory Visit: Payer: Self-pay | Admitting: Emergency Medicine

## 2013-07-15 ENCOUNTER — Encounter: Payer: Self-pay | Admitting: Emergency Medicine

## 2013-07-15 ENCOUNTER — Ambulatory Visit: Payer: BC Managed Care – PPO | Admitting: Emergency Medicine

## 2013-07-15 VITALS — BP 120/70 | HR 64 | Temp 98.0°F | Resp 16 | Wt 150.0 lb

## 2013-07-15 DIAGNOSIS — R5381 Other malaise: Secondary | ICD-10-CM

## 2013-07-15 DIAGNOSIS — J309 Allergic rhinitis, unspecified: Secondary | ICD-10-CM

## 2013-07-15 DIAGNOSIS — J329 Chronic sinusitis, unspecified: Secondary | ICD-10-CM

## 2013-07-15 LAB — CBC WITH DIFFERENTIAL/PLATELET
Basophils Absolute: 0 10*3/uL (ref 0.0–0.1)
Basophils Relative: 0 % (ref 0–1)
Eosinophils Absolute: 0.2 10*3/uL (ref 0.0–0.7)
Eosinophils Relative: 2 % (ref 0–5)
HCT: 40.8 % (ref 36.0–46.0)
Hemoglobin: 14.1 g/dL (ref 12.0–15.0)
Lymphocytes Relative: 32 % (ref 12–46)
Lymphs Abs: 2.2 10*3/uL (ref 0.7–4.0)
MCH: 30.5 pg (ref 26.0–34.0)
MCHC: 34.6 g/dL (ref 30.0–36.0)
MCV: 88.3 fL (ref 78.0–100.0)
Monocytes Absolute: 0.5 10*3/uL (ref 0.1–1.0)
Monocytes Relative: 7 % (ref 3–12)
Neutro Abs: 4 10*3/uL (ref 1.7–7.7)
Neutrophils Relative %: 59 % (ref 43–77)
Platelets: 227 10*3/uL (ref 150–400)
RBC: 4.62 MIL/uL (ref 3.87–5.11)
RDW: 13.5 % (ref 11.5–15.5)
WBC: 6.8 10*3/uL (ref 4.0–10.5)

## 2013-07-15 LAB — BASIC METABOLIC PANEL WITH GFR
BUN: 15 mg/dL (ref 6–23)
CO2: 28 mEq/L (ref 19–32)
Calcium: 9.5 mg/dL (ref 8.4–10.5)
Chloride: 102 mEq/L (ref 96–112)
Creat: 0.74 mg/dL (ref 0.50–1.10)
GFR, Est African American: >89 mL/min
GFR, Est Non African American: >89 mL/min
Glucose, Bld: 79 mg/dL (ref 70–99)
Potassium: 4.6 mEq/L (ref 3.5–5.3)
Sodium: 139 mEq/L (ref 135–145)

## 2013-07-15 LAB — HEPATIC FUNCTION PANEL
ALT: 11 U/L (ref 0–35)
AST: 14 U/L (ref 0–37)
Albumin: 4.5 g/dL (ref 3.5–5.2)
Alkaline Phosphatase: 43 U/L (ref 39–117)
Bilirubin, Direct: 0.3 mg/dL (ref 0.0–0.3)
Indirect Bilirubin: 1.3 mg/dL — ABNORMAL HIGH (ref 0.0–0.9)
Total Bilirubin: 1.6 mg/dL — ABNORMAL HIGH (ref 0.3–1.2)
Total Protein: 7.4 g/dL (ref 6.0–8.3)

## 2013-07-15 MED ORDER — AMOXICILLIN 875 MG PO TABS: 875.0000 mg | ORAL_TABLET | Freq: Two times a day (BID) | ORAL | Status: DC

## 2013-07-15 MED ORDER — BENZONATATE 100 MG PO CAPS: 100.0000 mg | ORAL_CAPSULE | Freq: Three times a day (TID) | ORAL | Status: DC | PRN

## 2013-07-15 NOTE — Patient Instructions (Signed)
Allergic Rhinitis Allergic rhinitis is when the mucous membranes in the nose respond to allergens. Allergens are particles in the air that cause your body to have an allergic reaction. This causes you to release allergic antibodies. Through a chain of events, these eventually cause you to release histamine into the blood stream (hence the use of antihistamines). Although meant to be protective to the body, it is this release that causes your discomfort, such as frequent sneezing, congestion and an itchy runny nose.  CAUSES  The pollen allergens may come from grasses, trees, and weeds. This is seasonal allergic rhinitis, or "hay fever." Other allergens cause year-round allergic rhinitis (perennial allergic rhinitis) such as house dust mite allergen, pet dander and mold spores.  SYMPTOMS   Nasal stuffiness (congestion).  Runny, itchy nose with sneezing and tearing of the eyes.  There is often an itching of the mouth, eyes and ears. It cannot be cured, but it can be controlled with medications. DIAGNOSIS  If you are unable to determine the offending allergen, skin or blood testing may find it. TREATMENT   Avoid the allergen.  Medications and allergy shots (immunotherapy) can help.  Hay fever may often be treated with antihistamines in pill or nasal spray forms. Antihistamines block the effects of histamine. There are over-the-counter medicines that may help with nasal congestion and swelling around the eyes. Check with your caregiver before taking or giving this medicine. If the treatment above does not work, there are many new medications your caregiver can prescribe. Stronger medications may be used if initial measures are ineffective. Desensitizing injections can be used if medications and avoidance fails. Desensitization is when a patient is given ongoing shots until the body becomes less sensitive to the allergen. Make sure you follow up with your caregiver if problems continue. SEEK MEDICAL  CARE IF:   You develop fever (more than 100.5 F (38.1 C).  You develop a cough that does not stop easily (persistent).  You have shortness of breath.  You start wheezing.  Symptoms interfere with normal daily activities. Document Released: 05/22/2001 Document Revised: 11/19/2011 Document Reviewed: 12/01/2008 [facility] Patient Information 2014 [place], [place]. Sinusitis Sinusitis is redness, soreness, and puffiness (inflammation) of the air pockets in the bones of your face (sinuses). The redness, soreness, and puffiness can cause air and mucus to get trapped in your sinuses. This can allow germs to grow and cause an infection.  HOME CARE   Drink enough fluids to keep your pee (urine) clear or pale yellow.  Use a humidifier in your home.  Run a hot shower to create steam in the bathroom. Sit in the bathroom with the door closed. Breathe in the steam 3 4 times a day.  Put a warm, moist washcloth on your face 3 4 times a day, or as told by your doctor.  Use salt water sprays (saline sprays) to wet the thick fluid in your nose. This can help the sinuses drain.  Only take medicine as told by your doctor. GET HELP RIGHT AWAY IF:   Your pain gets worse.  You have very bad headaches.  You are sick to your stomach (nauseous).  You throw up (vomit).  You are very sleepy (drowsy) all the time.  Your face is puffy (swollen).  Your vision changes.  You have a stiff neck.  You have trouble breathing. MAKE SURE YOU:   Understand these instructions.  Will watch your condition.  Will get help right away if you are not doing well  or get worse. Document Released: 02/13/2008 Document Revised: 05/21/2012 Document Reviewed: 04/01/2012 [facility] Patient Information 2014 [place], [place]. Fatigue Fatigue is a feeling of tiredness, lack of energy, lack of motivation, or feeling tired all the time. Having enough rest, good nutrition, and reducing stress will normally reduce fatigue.  Consult your caregiver if it persists. The nature of your fatigue will help your caregiver to find out its cause. The treatment is based on the cause.  CAUSES  There are many causes for fatigue. Most of the time, fatigue can be traced to one or more of your habits or routines. Most causes fit into one or more of three general areas. They are: Lifestyle problems  Sleep disturbances.  Overwork.  Physical exertion.  Unhealthy habits.  Poor eating habits or eating disorders.  Alcohol and/or drug use .  Lack of proper nutrition (malnutrition). Psychological problems  Stress and/or anxiety problems.  Depression.  Grief.  Boredom. Medical Problems or Conditions  Anemia.  Pregnancy.  Thyroid gland problems.  Recovery from major surgery.  Continuous pain.  Emphysema or asthma that is not well controlled  Allergic conditions.  Diabetes.  Infections (such as mononucleosis).  Obesity.  Sleep disorders, such as sleep apnea.  Heart failure or other heart-related problems.  Cancer.  Kidney disease.  Liver disease.  Effects of certain medicines such as antihistamines, cough and cold remedies, prescription pain medicines, heart and blood pressure medicines, drugs used for treatment of cancer, and some antidepressants. SYMPTOMS  The symptoms of fatigue include:   Lack of energy.  Lack of drive (motivation).  Drowsiness.  Feeling of indifference to the surroundings. DIAGNOSIS  The details of how you feel help guide your caregiver in finding out what is causing the fatigue. You will be asked about your present and past health condition. It is important to review all medicines that you take, including prescription and non-prescription items. A thorough exam will be done. You will be questioned about your feelings, habits, and normal lifestyle. Your caregiver may suggest blood tests, urine tests, or other tests to look for common medical causes of fatigue.  TREATMENT   Fatigue is treated by correcting the underlying cause. For example, if you have continuous pain or depression, treating these causes will improve how you feel. Similarly, adjusting the dose of certain medicines will help in reducing fatigue.  HOME CARE INSTRUCTIONS   Try to get the required amount of good sleep every night.  Eat a healthy and nutritious diet, and drink enough water throughout the day.  Practice ways of relaxing (including yoga or meditation).  Exercise regularly.  Make plans to change situations that cause stress. Act on those plans so that stresses decrease over time. Keep your work and personal routine reasonable.  Avoid street drugs and minimize use of alcohol.  Start taking a daily multivitamin after consulting your caregiver. SEEK MEDICAL CARE IF:   You have persistent tiredness, which cannot be accounted for.  You have fever.  You have unintentional weight loss.  You have headaches.  You have disturbed sleep throughout the night.  You are feeling sad.  You have constipation.  You have dry skin.  You have gained weight.  You are taking any new or different medicines that you suspect are causing fatigue.  You are unable to sleep at night.  You develop any unusual swelling of your legs or other parts of your body. SEEK IMMEDIATE MEDICAL CARE IF:   You are feeling confused.  Your vision is blurred.  You feel faint or pass out.  You develop severe headache.  You develop severe abdominal, pelvic, or back pain.  You develop chest pain, shortness of breath, or an irregular or fast heartbeat.  You are unable to pass a normal amount of urine.  You develop abnormal bleeding such as bleeding from the rectum or you vomit blood.  You have thoughts about harming yourself or committing suicide.  You are worried that you might harm someone else. MAKE SURE YOU:   Understand these instructions.  Will watch your condition.  Will get help right  away if you are not doing well or get worse. Document Released: 06/24/2007 Document Revised: 11/19/2011 Document Reviewed: 06/24/2007 [facility] Patient Information 2014 [place], [place].

## 2013-07-16 ENCOUNTER — Telehealth: Payer: Self-pay | Admitting: *Deleted

## 2013-07-16 LAB — VITAMIN B12: Vitamin B-12: 1183 pg/mL — ABNORMAL HIGH (ref 211–911)

## 2013-07-16 LAB — TSH: TSH: 1.96 u[IU]/mL (ref 0.350–4.500)

## 2013-07-16 LAB — VITAMIN D 25 HYDROXY (VIT D DEFICIENCY, FRACTURES): Vit D, 25-Hydroxy: 49 ng/mL (ref 30–89)

## 2013-07-16 NOTE — Telephone Encounter (Signed)
Message copied by Nicholaus Corolla A on Thu Jul 16, 2013 10:52 AM ------      Message from: [place], [place] R      Created: Thu Jul 16, 2013  9:23 AM       All labs WNL. Can add 2000 IU vitamin D may help with energy. Will request samples from [facility] rep to see if any help with fatigue. Call if symptoms of stress increase and wants RX. ------

## 2013-07-16 NOTE — Telephone Encounter (Signed)
Spoke with Pt about lab results from 07/15/13 ov with Loree Fee, PA-C.  Advised all labs WNL per Loree Fee, PA-C and advised pt to start vitamin D 2,000Iu to help with energy.  We will call pt when samples received from [facility] Rep.

## 2013-07-16 NOTE — Progress Notes (Signed)
  Subjective:    Patient ID: Brenda Bennett, female    DOB: [birth year], 37 y.o.   MRN: [id number]  HPI Comments: 37 yo female presents with fatigue for months despite healthy diet, exercise and vitamins. She notes she also has been having increased sinus congestion over last couple of weeks. Patient admits to stress and lack of sleep due to multiple awakenings with sick child x 12 years with his uncontrolled DM1. No OTCs for sleep or sinus symptoms.  Sinusitis Associated symptoms include sinus pressure.  Headache  Associated symptoms include sinus pressure.  Sore Throat     No current outpatient prescriptions on file prior to visit.   No current facility-administered medications on file prior to visit.  ALLERGIES Latex; Promethazine hcl; and Adhesive  Past Medical History  Diagnosis Date  . IBS (irritable bowel syndrome)   . UTI (lower urinary tract infection)   . Hyperbilirubinemia   . Endometriosis   . PONV (postoperative nausea and vomiting)     Transderm patch with last surg. worked "perfectly"      Review of Systems  Constitutional: Positive for fatigue.  HENT: Positive for postnasal drip and sinus pressure.   Respiratory: Negative.   Cardiovascular: Negative.   Endocrine: Negative.   Genitourinary: Negative.   Musculoskeletal: Negative.   Skin: Negative.   Neurological: Negative.   Psychiatric/Behavioral: Positive for sleep disturbance.      BP 120/70  Pulse 64  Temp(Src) 98 F (36.7 C) (Oral)  Resp 16  Wt 150 lb (68.04 kg)  LMP 10/21/2012   Objective:   Physical Exam  Nursing note and vitals reviewed. Constitutional: She is oriented to person, place, and time. She appears well-developed and well-nourished.  HENT:  Head: Normocephalic and atraumatic.  Right Ear: External ear normal.  Left Ear: External ear normal.  Nose: Nose normal.  Mouth/Throat: Oropharynx is clear and moist.  TM's yellow, Bilateral mild maxillary sinus tenderness is present.    Eyes: Pupils are equal, round, and reactive to light.  Neck: Normal range of motion. Neck supple.  Cardiovascular: Normal rate, regular rhythm, normal heart sounds and intact distal pulses.   Pulmonary/Chest: Effort normal and breath sounds normal.  Abdominal: Soft. Bowel sounds are normal.  Musculoskeletal: Normal range of motion.  Neurological: She is alert and oriented to person, place, and time.  Skin: Skin is warm and dry.  Psychiatric: She has a normal mood and affect. Her behavior is normal. Judgment and thought content normal.          Assessment & Plan:  Fatigue vs stress vs insomnia with child long discussion about sleep hygiene, possible medications, and checking labs. W/C if wants prescription for stress.  Sinus Amoxicillin 875 mg 1 PO BID #20 Tessalon Perles 100 mg 1-2 PO TID/PRN, Allegra AD, increase H2o

## 2013-08-13 ENCOUNTER — Telehealth: Payer: Self-pay | Admitting: Internal Medicine

## 2013-08-13 NOTE — Telephone Encounter (Signed)
PT REQUESTING TO PICK UP A COPY OF THE PATHOLOGY REPORT DONE IN OFFICE WITH MELISSA SMITH AND FAXED TO DERMATOLOGIST.  PT HAS MADE SEVERAL REQUESTS.  PLEASE ADVISE WHEN READY FOR PICK UP. AND THAT IT HAS BEEN FAXED TO [facility], FAX NUMBER [phone].  PT HAS APPT TODAY!   PT - [phone]

## 2013-08-13 NOTE — Telephone Encounter (Signed)
Path report was faxed to [facility] 04/2013, however, I re-faxed this morning attn: Kathie Rhodes [phone] and placed a copy up front for patient to pick up.

## 2013-10-09 ENCOUNTER — Ambulatory Visit (INDEPENDENT_AMBULATORY_CARE_PROVIDER_SITE_OTHER): Payer: BC Managed Care – PPO | Admitting: Emergency Medicine

## 2013-10-09 ENCOUNTER — Encounter: Payer: Self-pay | Admitting: Emergency Medicine

## 2013-10-09 VITALS — BP 120/82 | HR 60 | Temp 98.2°F | Resp 16 | Ht 65.5 in | Wt 150.0 lb

## 2013-10-09 DIAGNOSIS — L0291 Cutaneous abscess, unspecified: Secondary | ICD-10-CM

## 2013-10-09 DIAGNOSIS — L0390 Cellulitis, unspecified: Secondary | ICD-10-CM

## 2013-10-09 MED ORDER — DOXYCYCLINE HYCLATE 100 MG PO TABS: 100.0000 mg | ORAL_TABLET | Freq: Two times a day (BID) | ORAL | Status: DC

## 2013-10-09 NOTE — Progress Notes (Signed)
   Subjective:    Patient ID: Brenda Bennett, female    DOB: 05/11/1976, 38 y.o.   MRN: [id number]  HPI Comments: She has noticed a lump swelling on inside of left leg over the last 5 days. She has soaked/ warm compresses and gets a little pus out. + redness and hardness. She denies any new exposures/ trauma/ hx of MRSA.   No current outpatient prescriptions on file prior to visit.   No current facility-administered medications on file prior to visit.   ALLERGIES Latex; Promethazine hcl; and Adhesive  Past Medical History  Diagnosis Date  . IBS (irritable bowel syndrome)   . UTI (lower urinary tract infection)   . Hyperbilirubinemia   . Endometriosis   . PONV (postoperative nausea and vomiting)     Transderm patch with last surg. worked "perfectly"     Review of Systems  Skin: Positive for color change and wound.  All other systems reviewed and are negative.   BP 120/82  Pulse 60  Temp(Src) 98.2 F (36.8 C) (Temporal)  Resp 16  Ht 5' 5.5" (1.664 m)  Wt 150 lb (68.04 kg)  BMI 24.57 kg/m2  LMP 10/21/2012      Objective:   Physical Exam  Nursing note and vitals reviewed. Constitutional: She is oriented to person, place, and time. She appears well-developed and well-nourished.  Musculoskeletal: Normal range of motion. She exhibits no edema and no tenderness.  Neurological: She is oriented to person, place, and time.  Skin: Skin is warm and dry. There is erythema.     Psychiatric: She has a normal mood and affect. Judgment normal.          Assessment & Plan:  Abscess/ ? bite- Hygiene explained. Doxy 100 BID AD Attempted to obtain wound culture after verbal permission obtained. Area prepped with alcohol, # 10 blad used to make .5 cm incision into firm area, pressure applied and only blood expressed no exudate. Area was covered with Neosporin/ guaze/ paper tape. w/c if SX increase or ER.

## 2013-10-09 NOTE — Patient Instructions (Signed)
Abscess Care After An abscess (also called a boil or furuncle) is an infected area that contains a collection of pus. Signs and symptoms of an abscess include pain, tenderness, redness, or hardness, or you may feel a moveable soft area under your skin. An abscess can occur anywhere in the body. The infection may spread to surrounding tissues causing cellulitis. A cut (incision) by the surgeon was made over your abscess and the pus was drained out. Gauze may have been packed into the space to provide a drain that will allow the cavity to heal from the inside outwards. The boil may be painful for 5 to 7 days. Most people with a boil do not have high fevers. Your abscess, if seen early, may not have localized, and may not have been lanced. If not, another appointment may be required for this if it does not get better on its own or with medications. HOME CARE INSTRUCTIONS   Only take over-the-counter or prescription medicines for pain, discomfort, or fever as directed by your caregiver.  When you bathe, soak and then remove gauze or iodoform packs at least daily or as directed by your caregiver. You may then wash the wound gently with mild soapy water. Repack with gauze or do as your caregiver directs. SEEK IMMEDIATE MEDICAL CARE IF:   You develop increased pain, swelling, redness, drainage, or bleeding in the wound site.  You develop signs of generalized infection including muscle aches, chills, fever, or a general ill feeling.  An oral temperature above 102 F (38.9 C) develops, not controlled by medication. See your caregiver for a recheck if you develop any of the symptoms described above. If medications (antibiotics) were prescribed, take them as directed. Document Released: 03/15/2005 Document Revised: 11/19/2011 Document Reviewed: 11/10/2007 [facility] Patient Information 2014 [place]. Wound Care Wound care helps prevent pain and infection.  You may need a tetanus shot if:  You cannot  remember when you had your last tetanus shot.  You have never had a tetanus shot.  The injury broke your skin. If you need a tetanus shot and you choose not to have one, you may get tetanus. Sickness from tetanus can be serious. HOME CARE   Only take medicine as told by your doctor.  Clean the wound daily with mild soap and water.  Change any bandages (dressings) as told by your doctor.  Put medicated cream and a bandage on the wound as told by your doctor.  Change the bandage if it gets wet, dirty, or starts to smell.  Take showers. Do not take baths, swim, or do anything that puts your wound under water.  Rest and raise (elevate) the wound until the pain and puffiness (swelling) are better.  Keep all doctor visits as told. GET HELP RIGHT AWAY IF:   Yellowish-white fluid (pus) comes from the wound.  Medicine does not lessen your pain.  There is a red streak going away from the wound.  You have a fever. MAKE SURE YOU:   Understand these instructions.  Will watch your condition.  Will get help right away if you are not doing well or get worse. Document Released: 06/05/2008 Document Revised: 11/19/2011 Document Reviewed: 12/31/2010 [facility] Patient Information 2014 [place], [place].

## 2013-11-04 ENCOUNTER — Ambulatory Visit: Payer: Self-pay | Admitting: Emergency Medicine

## 2013-12-18 ENCOUNTER — Ambulatory Visit (INDEPENDENT_AMBULATORY_CARE_PROVIDER_SITE_OTHER): Payer: BC Managed Care – PPO | Admitting: Emergency Medicine

## 2013-12-18 ENCOUNTER — Encounter: Payer: Self-pay | Admitting: Emergency Medicine

## 2013-12-18 VITALS — BP 112/80 | HR 72 | Temp 98.0°F | Resp 18 | Ht 65.5 in | Wt 152.0 lb

## 2013-12-18 DIAGNOSIS — R5381 Other malaise: Secondary | ICD-10-CM

## 2013-12-18 DIAGNOSIS — R5383 Other fatigue: Principal | ICD-10-CM

## 2013-12-18 MED ORDER — ALPRAZOLAM 0.25 MG PO TABS: 0.2500 mg | ORAL_TABLET | Freq: Two times a day (BID) | ORAL | Status: AC | PRN

## 2013-12-18 NOTE — Patient Instructions (Signed)
Grief Reaction Grief is a normal response to the death of someone close to you. Feelings of fear, anger, and guilt can affect almost everyone who loses someone they love. Symptoms of depression are also common. These include problems with sleep, loss of appetite, and lack of energy. These grief reaction symptoms often last for weeks to months after a loss. They may also return during special times that remind you of the person you lost, such as an anniversary or birthday. Anxiety, insomnia, irritability, and deep depression may last beyond the period of normal grief. If you experience these feelings for 6 months or longer, you may have clinical depression. Clinical depression requires further medical attention. If you think that you have clinical depression, you should contact your caregiver. If you have a history of depression and or a family history of depression, you are at greater risk of clinical depression. You are also at greater risk of developing clinical depression if the loss was traumatic or the loss was of someone with whom you had unresolved issues.  A grief reaction can become complicated by being blocked. This means being unable to cry or express extreme emotions. This may prolong the grieving period and worsen the emotional effects of the loss. Mourning is a natural event in human life. A healthy grief reaction is one that is not blocked . It requires a time of sadness and readjustment.It is very important to share your sorrow and fear with others, especially close friends and family. Professional counselors and clergy can also help you process your grief. Document Released: 08/27/2005 Document Revised: 11/19/2011 Document Reviewed: 05/07/2006 [facility] Patient Information 2014 [place], [place]. Depression, Adult Depression is feeling sad, low, down in the dumps, blue, gloomy, or empty. In general, there are two kinds of depression:  Normal sadness or grief. This can happen after something  upsetting. It often goes away on its own within 2 weeks. After losing a loved one (bereavement), normal sadness and grief may last longer than two weeks. It usually gets better with time.  Clinical depression. This kind lasts longer than normal sadness or grief. It keeps you from doing the things you normally do in life. It is often hard to function at home, work, or at school. It may affect your relationships with others. Treatment is often needed. GET HELP RIGHT AWAY IF:  You have thoughts about hurting yourself or others.  You lose touch with reality (psychotic symptoms). You may:  See or hear things that are not real.  Have untrue beliefs about your life or people around you.  Your medicine is giving you problems. MAKE SURE YOU:  Understand these instructions.  Will watch your condition.  Will get help right away if you are not doing well or get worse. Document Released: 09/29/2010 Document Revised: 05/21/2012 Document Reviewed: 12/27/2011 [facility] Patient Information 2014 [place], [place]. Generalized Anxiety Disorder Generalized anxiety disorder (GAD) is a mental disorder. It interferes with life functions, including relationships, work, and school. GAD is different from normal anxiety, which everyone experiences at some point in their lives in response to specific life events and activities. Normal anxiety actually helps [place] prepare for and get through these life events and activities. Normal anxiety goes away after the event or activity is over.  GAD causes anxiety that is not necessarily related to specific events or activities. It also causes excess anxiety in proportion to specific events or activities. The anxiety associated with GAD is also difficult to control. GAD can vary from mild  to severe. People with severe GAD can have intense waves of anxiety with physical symptoms (panic attacks).  SYMPTOMS The anxiety and worry associated with GAD are difficult to control. This  anxiety and worry are related to many life events and activities and also occur more days than not for 6 months or longer. People with GAD also have three or more of the following symptoms (one or more in children):  Restlessness.   Fatigue.  Difficulty concentrating.   Irritability.  Muscle tension.  Difficulty sleeping or unsatisfying sleep. DIAGNOSIS GAD is diagnosed through an assessment by your caregiver. Your caregiver will ask you questions aboutyour mood,physical symptoms, and events in your life. Your caregiver may ask you about your medical history and use of alcohol or drugs, including prescription medications. Your caregiver may also do a physical exam and blood tests. Certain medical conditions and the use of certain substances can cause symptoms similar to those associated with GAD. Your caregiver may refer you to a mental health specialist for further evaluation. TREATMENT The following therapies are usually used to treat GAD:   Medication Antidepressant medication usually is prescribed for long-term daily control. Antianxiety medications may be added in severe cases, especially when panic attacks occur.   Talk therapy (psychotherapy) Certain types of talk therapy can be helpful in treating GAD by providing support, education, and guidance. A form of talk therapy called cognitive behavioral therapy can teach you healthy ways to think about and react to daily life events and activities.  Stress managementtechniques These include yoga, meditation, and exercise and can be very helpful when they are practiced regularly. A mental health specialist can help determine which treatment is best for you. Some people see improvement with one therapy. However, other people require a combination of therapies. Document Released: 12/22/2012 Document Reviewed: 12/22/2012 [facility] Patient Information 2014 [place], [place].

## 2013-12-18 NOTE — Progress Notes (Signed)
   Subjective:    Patient ID: Brenda Bennett, female    DOB: 1975/11/23, 38 y.o.   MRN: [id number]  HPI Comments: 38 yo female with increased fatigue with recent increased stress with grieving over loss of father unexpectedly on Christmas Day. She has noticed she feels a little more anxious than in past. She has been exercising which has helped some. She does not want to take RX if she can manage with out.     Medication List    Notice As of 12/18/2013 10:37 AM   You have not been prescribed any medications.     Allergies  Allergen Reactions  . Latex   . Promethazine Hcl   . Adhesive [Tape] Rash   Past Medical History  Diagnosis Date  . IBS (irritable bowel syndrome)   . UTI (lower urinary tract infection)   . Hyperbilirubinemia   . Endometriosis   . PONV (postoperative nausea and vomiting)     Transderm patch with last surg. worked "perfectly"       Review of Systems  Constitutional: Positive for fatigue.  Psychiatric/Behavioral: The patient is nervous/anxious.   All other systems reviewed and are negative.  BP 112/80  Pulse 72  Temp(Src) 98 F (36.7 C) (Temporal)  Resp 18  Ht 5' 5.5" (1.664 m)  Wt 152 lb (68.947 kg)  BMI 24.90 kg/m2  LMP 10/21/2012     Objective:   Physical Exam  Nursing note and vitals reviewed. Constitutional: She is oriented to person, place, and time. She appears well-developed and well-nourished.  HENT:  Head: Normocephalic and atraumatic.  Right Ear: External ear normal.  Left Ear: External ear normal.  Nose: Nose normal.  Mouth/Throat: Oropharynx is clear and moist.  Eyes: Conjunctivae and EOM are normal.  Neck: Normal range of motion.  Musculoskeletal: Normal range of motion.  Lymphadenopathy:    She has no cervical adenopathy.  Neurological: She is alert and oriented to person, place, and time.  Skin: Skin is warm and dry.  Psychiatric: She has a normal mood and affect. Her behavior is normal. Judgment and thought content  normal.  Tearful but appropriate          Assessment & Plan:  Fatigue vs anxiety/ grief over loss of father unexpectant- check labs, increase activity and H2O Anxiety .25 mg BID/ PRN, recommend Hospice counseling.

## 2014-02-02 ENCOUNTER — Ambulatory Visit (INDEPENDENT_AMBULATORY_CARE_PROVIDER_SITE_OTHER): Payer: BC Managed Care – PPO | Admitting: Emergency Medicine

## 2014-02-02 ENCOUNTER — Encounter: Payer: Self-pay | Admitting: Emergency Medicine

## 2014-02-02 VITALS — BP 108/64 | HR 66 | Temp 98.6°F | Resp 18 | Ht 65.5 in | Wt 153.0 lb

## 2014-02-02 DIAGNOSIS — R5383 Other fatigue: Principal | ICD-10-CM

## 2014-02-02 DIAGNOSIS — R5381 Other malaise: Secondary | ICD-10-CM

## 2014-02-02 DIAGNOSIS — R221 Localized swelling, mass and lump, neck: Secondary | ICD-10-CM

## 2014-02-02 DIAGNOSIS — Z8342 Family history of familial hypercholesterolemia: Secondary | ICD-10-CM

## 2014-02-02 DIAGNOSIS — R0602 Shortness of breath: Secondary | ICD-10-CM

## 2014-02-02 DIAGNOSIS — Z8349 Family history of other endocrine, nutritional and metabolic diseases: Secondary | ICD-10-CM

## 2014-02-02 LAB — LIPID PANEL
Cholesterol: 164 mg/dL (ref 0–200)
HDL: 67 mg/dL (ref >39)
LDL Cholesterol: 85 mg/dL (ref 0–99)
Total CHOL/HDL Ratio: 2.4 Ratio
Triglycerides: 59 mg/dL (ref <150)
VLDL: 12 mg/dL (ref 0–40)

## 2014-02-02 LAB — CBC WITH DIFFERENTIAL/PLATELET
Basophils Absolute: 0 10*3/uL (ref 0.0–0.1)
Basophils Relative: 0 % (ref 0–1)
Eosinophils Absolute: 0.1 10*3/uL (ref 0.0–0.7)
Eosinophils Relative: 1 % (ref 0–5)
HCT: 40.6 % (ref 36.0–46.0)
Hemoglobin: 14.1 g/dL (ref 12.0–15.0)
Lymphocytes Relative: 30 % (ref 12–46)
Lymphs Abs: 1.8 10*3/uL (ref 0.7–4.0)
MCH: 29.9 pg (ref 26.0–34.0)
MCHC: 34.7 g/dL (ref 30.0–36.0)
MCV: 86.2 fL (ref 78.0–100.0)
Monocytes Absolute: 0.4 10*3/uL (ref 0.1–1.0)
Monocytes Relative: 6 % (ref 3–12)
Neutro Abs: 3.8 10*3/uL (ref 1.7–7.7)
Neutrophils Relative %: 63 % (ref 43–77)
Platelets: 221 10*3/uL (ref 150–400)
RBC: 4.71 MIL/uL (ref 3.87–5.11)
RDW: 13.5 % (ref 11.5–15.5)
WBC: 6 10*3/uL (ref 4.0–10.5)

## 2014-02-02 LAB — HEPATIC FUNCTION PANEL
ALT: 11 U/L (ref 0–35)
AST: 14 U/L (ref 0–37)
Albumin: 4.3 g/dL (ref 3.5–5.2)
Alkaline Phosphatase: 36 U/L — ABNORMAL LOW (ref 39–117)
Bilirubin, Direct: 0.4 mg/dL — ABNORMAL HIGH (ref 0.0–0.3)
Indirect Bilirubin: 1.9 mg/dL — ABNORMAL HIGH (ref 0.2–1.2)
Total Bilirubin: 2.3 mg/dL — ABNORMAL HIGH (ref 0.2–1.2)
Total Protein: 6.8 g/dL (ref 6.0–8.3)

## 2014-02-02 LAB — TSH: TSH: 1.175 u[IU]/mL (ref 0.350–4.500)

## 2014-02-02 LAB — BASIC METABOLIC PANEL WITH GFR
BUN: 14 mg/dL (ref 6–23)
CO2: 27 mEq/L (ref 19–32)
Calcium: 9.1 mg/dL (ref 8.4–10.5)
Chloride: 104 mEq/L (ref 96–112)
Creat: 0.71 mg/dL (ref 0.50–1.10)
GFR, Est African American: >89 mL/min
GFR, Est Non African American: >89 mL/min
Glucose, Bld: 89 mg/dL (ref 70–99)
Potassium: 3.7 mEq/L (ref 3.5–5.3)
Sodium: 139 mEq/L (ref 135–145)

## 2014-02-02 LAB — IRON AND TIBC
%SAT: 34 % (ref 20–55)
Iron: 94 ug/dL (ref 42–145)
TIBC: 278 ug/dL (ref 250–470)
UIBC: 184 ug/dL (ref 125–400)

## 2014-02-02 LAB — VITAMIN B12: Vitamin B-12: 526 pg/mL (ref 211–911)

## 2014-02-02 NOTE — Progress Notes (Signed)
   Subjective:    Patient ID: Brenda Bennett, female    DOB: 17-Oct-1975, 38 y.o.   MRN: [id number]  HPI Comments: 38 yo WF with chronic fatigue on/off with history sleep deprivation with sick child but notes increase again like previously before hysterectomy. She had GYN evaluation recently with right Ovary with multiple cysts and one with mild concern with recheck due 6/ 29. She notes Hysterectomy last year but noticeable blood after running occasionally.  She has family hx with THyroid DZ and cancer. She has also noticed increased SOB. SHe lost her father 5 months ago to MI unexpectant and mother with pacemaker. NEG U/S NEck 2012  She is feeling more anxious but doesn't want to take a medication QD. She occasionally takes Xanax TID but rarely.     Medication List       This list is accurate as of: 02/02/14 10:08 AM.  Always use your most recent med list.               ALPRAZolam 0.25 MG tablet  Commonly known as:  XANAX  Take 1 tablet (0.25 mg total) by mouth 2 (two) times daily as needed for anxiety.     VITAMIN B 12 PO  Take by mouth daily.       Allergies  Allergen Reactions  . Latex   . Promethazine Hcl   . Adhesive [Tape] Rash   Past Medical History  Diagnosis Date  . IBS (irritable bowel syndrome)   . UTI (lower urinary tract infection)   . Hyperbilirubinemia   . Endometriosis   . PONV (postoperative nausea and vomiting)     Transderm patch with last surg. worked "perfectly"      Review of Systems  Constitutional: Positive for fatigue.  Respiratory: Positive for shortness of breath.   Psychiatric/Behavioral: Positive for sleep disturbance.  All other systems reviewed and are negative.  BP 108/64  Pulse 66  Temp(Src) 98.6 F (37 C) (Temporal)  Resp 18  Ht 5' 5.5" (1.664 m)  Wt 153 lb (69.4 kg)  BMI 25.06 kg/m2  LMP 10/21/2012     Objective:   Physical Exam  Nursing note and vitals reviewed. Constitutional: She is oriented to person, place, and  time. She appears well-developed and well-nourished.  HENT:  Head: Normocephalic and atraumatic.  Right Ear: External ear normal.  Left Ear: External ear normal.  Nose: Nose normal.  Mouth/Throat: Oropharynx is clear and moist.  Eyes: Conjunctivae and EOM are normal.  Neck: Normal range of motion. Neck supple.  Anterior fullness  Cardiovascular: Normal rate, regular rhythm, normal heart sounds and intact distal pulses.   Pulmonary/Chest: Effort normal and breath sounds normal.  Musculoskeletal: Normal range of motion.  Lymphadenopathy:    She has no cervical adenopathy.  Neurological: She is alert and oriented to person, place, and time.  Skin: Skin is warm and dry.  Psychiatric: She has a normal mood and affect. Judgment normal.          Assessment & Plan:  1. Fatigue with SOB with + FHX heart dz vs ovarian abnormality- check labs, increase activity and H2O, if all labs/ u/s NEG needs Cardio REFER, advised of signs of ovarian cancer w/c if occur  2. Anterior neck fullness with FHX with thyroid- Get U/S  3. Anxiety- Try to take 1 PO BID x 1 week and call with results, may need change to different RX or increase dose.

## 2014-02-02 NOTE — Patient Instructions (Signed)

## 2014-02-04 ENCOUNTER — Encounter: Payer: Self-pay | Admitting: Emergency Medicine

## 2014-02-04 ENCOUNTER — Other Ambulatory Visit: Payer: BC Managed Care – PPO

## 2014-02-08 ENCOUNTER — Ambulatory Visit: Payer: Self-pay | Admitting: Emergency Medicine

## 2014-02-10 ENCOUNTER — Other Ambulatory Visit: Payer: Self-pay | Admitting: Emergency Medicine

## 2014-02-10 DIAGNOSIS — R5383 Other fatigue: Principal | ICD-10-CM

## 2014-02-10 DIAGNOSIS — R5381 Other malaise: Secondary | ICD-10-CM

## 2014-02-11 ENCOUNTER — Other Ambulatory Visit: Payer: BC Managed Care – PPO

## 2014-02-18 ENCOUNTER — Encounter: Payer: Self-pay | Admitting: *Deleted

## 2014-02-22 ENCOUNTER — Encounter: Payer: Self-pay | Admitting: Cardiology

## 2014-02-22 ENCOUNTER — Ambulatory Visit (INDEPENDENT_AMBULATORY_CARE_PROVIDER_SITE_OTHER): Payer: BC Managed Care – PPO | Admitting: Cardiology

## 2014-02-22 VITALS — BP 142/92 | HR 49 | Ht 65.5 in | Wt 152.0 lb

## 2014-02-22 DIAGNOSIS — R001 Bradycardia, unspecified: Secondary | ICD-10-CM | POA: Insufficient documentation

## 2014-02-22 DIAGNOSIS — R5383 Other fatigue: Secondary | ICD-10-CM | POA: Insufficient documentation

## 2014-02-22 DIAGNOSIS — R5381 Other malaise: Secondary | ICD-10-CM

## 2014-02-22 DIAGNOSIS — R42 Dizziness and giddiness: Secondary | ICD-10-CM

## 2014-02-22 DIAGNOSIS — R0600 Dyspnea, unspecified: Secondary | ICD-10-CM

## 2014-02-22 DIAGNOSIS — R0609 Other forms of dyspnea: Secondary | ICD-10-CM

## 2014-02-22 DIAGNOSIS — R0989 Other specified symptoms and signs involving the circulatory and respiratory systems: Secondary | ICD-10-CM

## 2014-02-22 DIAGNOSIS — I498 Other specified cardiac arrhythmias: Secondary | ICD-10-CM

## 2014-02-22 NOTE — Progress Notes (Signed)
Patient ID: GEETIKA LABORDE, female   DOB: 1976/03/13, 38 y.o.   MRN: [id number]    Patient Name: Brenda Bennett Date of Encounter: 02/22/2014  Primary Care Provider:  Alesia Richards, MD Primary Cardiologist:  Dorothy Spark  Problem List   Past Medical History  Diagnosis Date  . IBS (irritable bowel syndrome)   . UTI (lower urinary tract infection)   . Hyperbilirubinemia   . Endometriosis   . PONV (postoperative nausea and vomiting)     Transderm patch with last surg. worked "perfectly"   Past Surgical History  Procedure Laterality Date  . Laparoscopy  2/99, 12/12    AND ABLASION  . Robotic assisted total hysterectomy N/A 11/10/2012    Procedure: ROBOTIC ASSISTED TOTAL HYSTERECTOMY;  Surgeon: Lovenia Kim, MD;  Location: [facility];  Service: Gynecology;  Laterality: N/A;  3 hrs.  . Bilateral salpingectomy Bilateral 11/10/2012    Procedure: BILATERAL SALPINGECTOMY;  Surgeon: Lovenia Kim, MD;  Location: [facility];  Service: Gynecology;  Laterality: Bilateral;    Allergies  Allergies  Allergen Reactions  . Latex   . Promethazine Hcl   . Adhesive [Tape] Rash    HPI  38 yo with prior medical history of anemia and B12 deficiency that underwent hysterectomy about a year ago. The patient has history of chronic fatigue for which she has been evaluated without any significant abnormality being found. Her fatigue has improved a little but since her vitamin B-12 has been replaced however she still feels significantly tired and her some days she can't even make it out of the couch or to keep her eyes open. At the baseline the patient is very active and is trying to exercise every day. She is waking up at 5 am is running approximately 10 miles a week. She states that as she exercises her fatigue actually improves. About 2 times a week she goes to the gym and exercises for about 45 minutes. She has a full-time job as a Freight forwarder and she has a sick child with diabetes for which she  wakes up every night to check his blood sugar. She otherwise doesn't have significant past medical history, and denies any prior hypertension or hyperlipidemia. Her mother just recently had a pacemaker implanted for significant bradycardia (the patient doesn't know further details), she was symptomatic with profound fatigue and and dizziness. Her father just died recently of a massive heart attack is her first presentation of coronary artery disease. As a consequence all of the siblings are getting checked. The patient denies any chest pain or prior episode of syncope.  She is also suffering from anxiety and was prescribed Ativan in the past which she uses occasionally but she rather goes for one to relieve her anxiety.   Home Medications  Prior to Admission medications   Medication Sig Start Date End Date Taking? Authorizing Provider  ALPRAZolam (XANAX) 0.25 MG tablet Take 1 tablet (0.25 mg total) by mouth 2 (two) times daily as needed for anxiety. 12/18/13 12/18/14 Yes Melissa R Smith, PA-C  Cyanocobalamin (VITAMIN B 12 PO) Take by mouth daily.   Yes Historical Provider, MD    Family History  Family History  Problem Relation Age of Onset  . Breast cancer Paternal Aunt   . Colon cancer Paternal Grandfather     70'S  . Colon polyps Father   . Celiac disease Brother   . Diabetes Son   . Gastric cancer Maternal Grandfather   . Celiac disease Son   .  Colon cancer      aunt  . Heart attack Father     Social History  History   Social History  . Marital Status: Married    Spouse Name: N/A    Number of Children: 2  . Years of Education: N/A   Occupational History  . Government social research officer    Social History Main Topics  . Smoking status: Never Smoker   . Smokeless tobacco: Never Used  . Alcohol Use: Yes     Comment: 1 DRINK EVERY 2-3 MONTHS  . Drug Use: No  . Sexual Activity: Not on file   Other Topics Concern  . Not on file   Social History Narrative  . No narrative on file       Review of Systems, as per HPI, otherwise negative General:  No chills, fever, night sweats or weight changes.  Cardiovascular:  No chest pain, dyspnea on exertion, edema, orthopnea, palpitations, paroxysmal nocturnal dyspnea. Dermatological: No rash, lesions/masses Respiratory: No cough, dyspnea Urologic: No hematuria, dysuria Abdominal:   No nausea, vomiting, diarrhea, bright red blood per rectum, melena, or hematemesis Neurologic:  No visual changes, wkns, changes in mental status. All other systems reviewed and are otherwise negative except as noted above.  Physical Exam  Blood pressure 142/92, pulse 49, height 5' 5.5" (1.664 m), weight 152 lb (68.947 kg), last menstrual period 10/21/2012.  General: Pleasant, NAD Psych: Normal affect. Neuro: Alert and oriented X 3. Moves all extremities spontaneously. HEENT: Normal  Neck: Supple without bruits or JVD. Lungs:  Resp regular and unlabored, CTA. Heart: RRR no s3, s4, or murmurs. Abdomen: Soft, non-tender, non-distended, BS + x 4.  Extremities: No clubbing, cyanosis or edema. DP/PT/Radials 2+ and equal bilaterally.  Labs:  No results found for this basename: CKTOTAL, CKMB, TROPONINI,  in the last 72 hours Lab Results  Component Value Date   WBC 6.0 02/02/2014   HGB 14.1 02/02/2014   HCT 40.6 02/02/2014   MCV 86.2 02/02/2014   PLT 221 02/02/2014    No results found for this basename: DDIMER   No components found with this basename: POCBNP,     Component Value Date/Time   NA 139 02/02/2014 1026   K 3.7 02/02/2014 1026   CL 104 02/02/2014 1026   CO2 27 02/02/2014 1026   GLUCOSE 89 02/02/2014 1026   BUN 14 02/02/2014 1026   CREATININE 0.71 02/02/2014 1026   CREATININE 0.77 11/10/2012 1859   CALCIUM 9.1 02/02/2014 1026   PROT 6.8 02/02/2014 1026   ALBUMIN 4.3 02/02/2014 1026   AST 14 02/02/2014 1026   ALT 11 02/02/2014 1026   ALKPHOS 36* 02/02/2014 1026   BILITOT 2.3* 02/02/2014 1026   GFRNONAA >89 02/02/2014 1026   GFRNONAA >90  11/10/2012 1859   GFRAA >89 02/02/2014 1026   GFRAA >90 11/10/2012 1859   Lab Results  Component Value Date   CHOL 164 02/02/2014   HDL 67 02/02/2014   LDLCALC 85 02/02/2014   TRIG 59 02/02/2014    Accessory Clinical Findings  Echocardiogram - none  ECG - sinus bradycardia, 49 beats per minute, rightward axis, incomplete right bundle branch block.    Assessment & Plan  A very pleasant 38 year old female who is coming complaining of profound fatigue, lack of energy occasional dyspnea on exertion and bradycardia. Patient had set of labs performed with TSH and free T4 being normal as well as his electrolytes and kidney function. She has elevation off in direct brew been an LP that  might suggest biliary obstruction. The patient is asymptomatic.  We'll check a cortisol level. Her bradycardia is concerning and it might be a sign of just excellent exercise capacity with high vagal tone however at the same time it might be a sign of chronotropic incompetence that might be causing her fatigue. Therefore we will schedule a 24-hour Holter monitor to evaluate for heart rate during the day, more significant AV blocks. The patient is advised to exercise while wearing heart monitor. We will also order an echocardiogram to rule out any structural heart disease. We'll also order an exercise treadmill stress test to evaluate for chronotropic incompetence, rule out any ischemia, and evaluate for blood pressure response to stress as her blood pressure has been borderline today and she states she has never had elevated blood pressure before.  Follow up in 6 weeks.  Dorothy Spark, MD, [facility] 02/22/2014, 12:16 PM

## 2014-02-22 NOTE — Patient Instructions (Signed)
**Note De-Identified Brenda Bennett Obfuscation** Your physician has requested that you have an echocardiogram. Echocardiography is a painless test that uses sound waves to create images of your heart. It provides your doctor with information about the size and shape of your heart and how well your heart's chambers and valves are working. This procedure takes approximately one hour. There are no restrictions for this procedure.  Your physician has recommended that you wear a 24 holter monitor. Holter monitors are medical devices that record the heart's electrical activity. Doctors most often use these monitors to diagnose arrhythmias. Arrhythmias are problems with the speed or rhythm of the heartbeat. The monitor is a small, portable device. You can wear one while you do your normal daily activities. This is usually used to diagnose what is causing palpitations/syncope (passing out).  Your physician has requested that you have an exercise tolerance test. For further information please visit [email or web address]. Please also follow instruction sheet, as given.  Your physician recommends that you return for lab work in: today  Your physician recommends that you schedule a follow-up appointment in: next available after testing is completed

## 2014-02-23 ENCOUNTER — Encounter (INDEPENDENT_AMBULATORY_CARE_PROVIDER_SITE_OTHER): Payer: BC Managed Care – PPO

## 2014-02-23 ENCOUNTER — Encounter: Payer: Self-pay | Admitting: Radiology

## 2014-02-23 DIAGNOSIS — R001 Bradycardia, unspecified: Secondary | ICD-10-CM

## 2014-02-23 DIAGNOSIS — R42 Dizziness and giddiness: Secondary | ICD-10-CM

## 2014-02-23 LAB — CORTISOL: Cortisol: 6.5 ug/dL (ref 2.3–19.4)

## 2014-02-23 NOTE — Progress Notes (Signed)
Patient ID: Brenda Bennett, female   DOB: 1976/01/26, 38 y.o.   MRN: [id number] E cardio 24hr holter applied

## 2014-02-25 ENCOUNTER — Ambulatory Visit (HOSPITAL_COMMUNITY)
Admission: RE | Admit: 2014-02-25 | Discharge: 2014-02-25 | Disposition: A | Discharge status: Home / Self Care | Payer: BC Managed Care – PPO | Source: Ambulatory Visit | Attending: Cardiology | Admitting: Cardiology

## 2014-02-25 ENCOUNTER — Telehealth: Payer: Self-pay | Admitting: *Deleted

## 2014-02-25 DIAGNOSIS — R001 Bradycardia, unspecified: Secondary | ICD-10-CM

## 2014-02-25 DIAGNOSIS — R5383 Other fatigue: Secondary | ICD-10-CM

## 2014-02-25 DIAGNOSIS — R5381 Other malaise: Secondary | ICD-10-CM | POA: Insufficient documentation

## 2014-02-25 DIAGNOSIS — I498 Other specified cardiac arrhythmias: Secondary | ICD-10-CM | POA: Insufficient documentation

## 2014-02-25 NOTE — Telephone Encounter (Signed)
Pt contacted about normal holter monitor results per Dr Meda Coffee.  Pt verbalized understanding and pleased that her tests are coming back normal.  Informed pt I will notify her when her stress test results have been reviewed.  Pt agrees with this plan.

## 2014-02-26 ENCOUNTER — Encounter (HOSPITAL_COMMUNITY): Payer: BC Managed Care – PPO

## 2014-03-01 ENCOUNTER — Encounter: Payer: Self-pay | Admitting: Emergency Medicine

## 2014-03-01 ENCOUNTER — Ambulatory Visit (INDEPENDENT_AMBULATORY_CARE_PROVIDER_SITE_OTHER): Payer: BC Managed Care – PPO | Admitting: Emergency Medicine

## 2014-03-01 VITALS — BP 122/64 | HR 62 | Temp 98.6°F | Resp 18 | Ht 65.5 in | Wt 153.0 lb

## 2014-03-01 DIAGNOSIS — R599 Enlarged lymph nodes, unspecified: Secondary | ICD-10-CM

## 2014-03-01 DIAGNOSIS — R221 Localized swelling, mass and lump, neck: Secondary | ICD-10-CM

## 2014-03-01 DIAGNOSIS — R591 Generalized enlarged lymph nodes: Secondary | ICD-10-CM

## 2014-03-01 DIAGNOSIS — R220 Localized swelling, mass and lump, head: Secondary | ICD-10-CM

## 2014-03-01 LAB — BASIC METABOLIC PANEL WITH GFR
BUN: 14 mg/dL (ref 6–23)
CO2: 26 mEq/L (ref 19–32)
Calcium: 9.7 mg/dL (ref 8.4–10.5)
Chloride: 103 mEq/L (ref 96–112)
Creat: 0.83 mg/dL (ref 0.50–1.10)
GFR, Est African American: >89 mL/min
GFR, Est Non African American: 89 mL/min
Glucose, Bld: 85 mg/dL (ref 70–99)
Potassium: 4.3 mEq/L (ref 3.5–5.3)
Sodium: 137 mEq/L (ref 135–145)

## 2014-03-01 LAB — CBC WITH DIFFERENTIAL/PLATELET
Basophils Absolute: 0 10*3/uL (ref 0.0–0.1)
Basophils Relative: 0 % (ref 0–1)
Eosinophils Absolute: 0.2 10*3/uL (ref 0.0–0.7)
Eosinophils Relative: 3 % (ref 0–5)
HCT: 41.3 % (ref 36.0–46.0)
Hemoglobin: 14.4 g/dL (ref 12.0–15.0)
Lymphocytes Relative: 35 % (ref 12–46)
Lymphs Abs: 2 10*3/uL (ref 0.7–4.0)
MCH: 30.2 pg (ref 26.0–34.0)
MCHC: 34.9 g/dL (ref 30.0–36.0)
MCV: 86.6 fL (ref 78.0–100.0)
Monocytes Absolute: 0.5 10*3/uL (ref 0.1–1.0)
Monocytes Relative: 8 % (ref 3–12)
Neutro Abs: 3.1 10*3/uL (ref 1.7–7.7)
Neutrophils Relative %: 54 % (ref 43–77)
Platelets: 213 10*3/uL (ref 150–400)
RBC: 4.77 MIL/uL (ref 3.87–5.11)
RDW: 13.4 % (ref 11.5–15.5)
WBC: 5.8 10*3/uL (ref 4.0–10.5)

## 2014-03-01 NOTE — Progress Notes (Signed)
   Subjective:    Patient ID: Brenda Bennett, female    DOB: April 16, 1976, 38 y.o.   MRN: [id number]  HPI Comments: 38 yo WF Patient presents with concerns about "nodule" left side of neck.  Noticed times 2 days, mild tenderness. She denies any recent illness.   Cardiologist pending she has echo tomorrow, stress test last week, halter monitor last week and she is still tired. She has gyn f/u next wek for abnormal ovaries. She has THyroid U/S next week.   CT NECK 8/ 2012 IMPRESSION:  1. Palpable area of concern corresponds to a 4 mm left  supraclavicular lymph node which is within normal limits. This is  just lateral to the left external jugular vein which appears  normal. No mass, abnormality, or suspicious finding.  2. Unremarkable neck CT otherwise.   Review of Systems BP 122/64  Pulse 62  Temp(Src) 98.6 F (37 C) (Temporal)  Resp 18  Ht 5' 5.5" (1.664 m)  Wt 153 lb (69.4 kg)  BMI 25.06 kg/m2  LMP 10/21/2012     Objective:   Physical Exam  Nursing note and vitals reviewed. Constitutional: She is oriented to person, place, and time. She appears well-developed and well-nourished.  HENT:  Head: Normocephalic and atraumatic.  Right Ear: External ear normal.  Left Ear: External ear normal.  Nose: Nose normal.  Mouth/Throat: Oropharynx is clear and moist.  Eyes: Conjunctivae and EOM are normal.  Neck: Normal range of motion. Neck supple.    Cardiovascular: Normal rate, regular rhythm, normal heart sounds and intact distal pulses.   Pulmonary/Chest: Effort normal and breath sounds normal.  Musculoskeletal: Normal range of motion.  Lymphadenopathy:    She has cervical adenopathy.  Neurological: She is alert and oriented to person, place, and time.  Skin: Skin is warm and dry.  Psychiatric: She has a normal mood and affect. Judgment normal.          Assessment & Plan:  1. Anxiety- Viibryd SX x 3 starter pak. Start slow titration with food.  2. Lymphadenopathy- CT  scan repeat to evaluate, check labs

## 2014-03-01 NOTE — Patient Instructions (Signed)
°Lymphadenopathy °Lymphadenopathy means "disease of the lymph glands." But the term is usually used to describe swollen or enlarged lymph glands, also called lymph nodes. These are the bean-shaped organs found in many locations including the neck, underarm, and groin. Lymph glands are part of the immune system, which fights infections in your body. Lymphadenopathy can occur in just one area of the body, such as the neck, or it can be generalized, with lymph node enlargement in several areas. The nodes found in the neck are the most common sites of lymphadenopathy. °CAUSES  °When your immune system responds to germs (such as viruses or bacteria ), infection-fighting cells and fluid build up. This causes the glands to grow in size. This is usually not something to worry about. Sometimes, the glands themselves can become infected and inflamed. This is called lymphadenitis. °Enlarged lymph nodes can be caused by many diseases: °· Bacterial disease, such as strep throat or a skin infection. °· Viral disease, such as a common cold. °· Other germs, such as lyme disease, tuberculosis, or sexually transmitted diseases. °· Cancers, such as lymphoma (cancer of the lymphatic system) or leukemia (cancer of the white blood cells). °· Inflammatory diseases such as lupus or rheumatoid arthritis. °· Reactions to medications. °Many of the diseases above are rare, but important. This is why you should see your caregiver if you have lymphadenopathy. °SYMPTOMS  °· Swollen, enlarged lumps in the neck, back of the head or other locations. °· Tenderness. °· Warmth or redness of the skin over the lymph nodes. °· Fever. °DIAGNOSIS  °Enlarged lymph nodes are often near the source of infection. They can help healthcare providers diagnose your illness. For instance:  °· Swollen lymph nodes around the jaw might be caused by an infection in the mouth. °· Enlarged glands in the neck often signal a throat infection. °· Lymph nodes that are swollen  in more than one area often indicate an illness caused by a virus. °Your caregiver most likely will know what is causing your lymphadenopathy after listening to your history and examining you. Blood tests, x-rays or other tests may be needed. If the cause of the enlarged lymph node cannot be found, and it does not go away by itself, then a biopsy may be needed. Your caregiver will discuss this with you. °TREATMENT  °Treatment for your enlarged lymph nodes will depend on the cause. Many times the nodes will shrink to normal size by themselves, with no treatment. Antibiotics or other medicines may be needed for infection. Only take over-the-counter or prescription medicines for pain, discomfort or fever as directed by your caregiver. °HOME CARE INSTRUCTIONS  °Swollen lymph glands usually return to normal when the underlying medical condition goes away. If they persist, contact your health-care provider. He/she might prescribe antibiotics or other treatments, depending on the diagnosis. Take any medications exactly as prescribed. Keep any follow-up appointments made to check on the condition of your enlarged nodes.  °SEEK MEDICAL CARE IF:  °· Swelling lasts for more than two weeks. °· You have symptoms such as weight loss, night sweats, fatigue or fever that does not go away. °· The lymph nodes are hard, seem fixed to the skin or are growing rapidly. °· Skin over the lymph nodes is red and inflamed. This could mean there is an infection. °SEEK IMMEDIATE MEDICAL CARE IF:  °· Fluid starts leaking from the area of the enlarged lymph node. °· You develop a fever of 102° F (38.9° C) or greater. °· Severe   pain develops (not necessarily at the site of a large lymph node). °· You develop chest pain or shortness of breath. °· You develop worsening abdominal pain. °MAKE SURE YOU:  °· Understand these instructions. °· Will watch your condition. °· Will get help right away if you are not doing well or get worse. °Document  Released: 06/05/2008 Document Revised: 11/19/2011 Document Reviewed: 06/05/2008 °Exit[employer]® Patient Information ©2015 [employer]. This information is not intended to replace advice given to you by your health care provider. Make sure you discuss any questions you have with your health care provider. ° ° ° °

## 2014-03-02 ENCOUNTER — Ambulatory Visit (HOSPITAL_COMMUNITY)
Admission: RE | Admit: 2014-03-02 | Discharge: 2014-03-02 | Disposition: A | Discharge status: Home / Self Care | Payer: BC Managed Care – PPO | Source: Ambulatory Visit | Attending: Internal Medicine | Admitting: Internal Medicine

## 2014-03-02 DIAGNOSIS — R072 Precordial pain: Secondary | ICD-10-CM

## 2014-03-02 DIAGNOSIS — R0609 Other forms of dyspnea: Secondary | ICD-10-CM

## 2014-03-02 DIAGNOSIS — R079 Chest pain, unspecified: Secondary | ICD-10-CM | POA: Insufficient documentation

## 2014-03-02 DIAGNOSIS — R0600 Dyspnea, unspecified: Secondary | ICD-10-CM

## 2014-03-02 NOTE — Progress Notes (Signed)
2D Echo Performed 03/02/2014    Brenda Bennett, RCS

## 2014-03-05 ENCOUNTER — Ambulatory Visit
Admission: RE | Admit: 2014-03-05 | Discharge: 2014-03-05 | Disposition: A | Discharge status: Home / Self Care | Payer: BC Managed Care – PPO | Source: Ambulatory Visit | Attending: Emergency Medicine | Admitting: Emergency Medicine

## 2014-03-05 DIAGNOSIS — R221 Localized swelling, mass and lump, neck: Secondary | ICD-10-CM

## 2014-03-05 DIAGNOSIS — R591 Generalized enlarged lymph nodes: Secondary | ICD-10-CM

## 2014-03-05 MED ORDER — IOHEXOL 300 MG/ML  SOLN
75.0000 mL | Freq: Once | INTRAMUSCULAR | Status: AC | PRN
Start: 2014-03-05 — End: 2014-03-05
  Administered 2014-03-05: 75 mL via INTRAVENOUS

## 2014-03-08 ENCOUNTER — Encounter: Payer: Self-pay | Admitting: Emergency Medicine

## 2014-03-08 NOTE — Telephone Encounter (Signed)
Patient called to request that she receive a call to review her my chart labs.

## 2014-03-09 ENCOUNTER — Other Ambulatory Visit: Payer: Self-pay | Admitting: Emergency Medicine

## 2014-03-09 DIAGNOSIS — R6889 Other general symptoms and signs: Secondary | ICD-10-CM

## 2014-03-09 DIAGNOSIS — R590 Localized enlarged lymph nodes: Secondary | ICD-10-CM

## 2014-03-10 ENCOUNTER — Ambulatory Visit (INDEPENDENT_AMBULATORY_CARE_PROVIDER_SITE_OTHER): Payer: BC Managed Care – PPO | Admitting: Cardiology

## 2014-03-10 ENCOUNTER — Telehealth: Payer: Self-pay | Admitting: Hematology and Oncology

## 2014-03-10 ENCOUNTER — Encounter: Payer: Self-pay | Admitting: Cardiology

## 2014-03-10 VITALS — BP 110/68 | HR 59 | Ht 65.5 in | Wt 153.0 lb

## 2014-03-10 DIAGNOSIS — R5381 Other malaise: Secondary | ICD-10-CM

## 2014-03-10 DIAGNOSIS — R5383 Other fatigue: Secondary | ICD-10-CM

## 2014-03-10 DIAGNOSIS — I498 Other specified cardiac arrhythmias: Secondary | ICD-10-CM

## 2014-03-10 DIAGNOSIS — R5382 Chronic fatigue, unspecified: Secondary | ICD-10-CM

## 2014-03-10 DIAGNOSIS — R001 Bradycardia, unspecified: Secondary | ICD-10-CM

## 2014-03-10 NOTE — Progress Notes (Signed)
Patient ID: Brenda Bennett, female   DOB: 14-Aug-1976, 38 y.o.   MRN: [id number]    Patient Name: Brenda Bennett Date of Encounter: 03/10/2014  Primary Care Provider:  Alesia Richards, MD Primary Cardiologist:  Dorothy Spark  Problem List   Past Medical History  Diagnosis Date  . IBS (irritable bowel syndrome)   . UTI (lower urinary tract infection)   . Hyperbilirubinemia   . Endometriosis   . PONV (postoperative nausea and vomiting)     Transderm patch with last surg. worked "perfectly"   Past Surgical History  Procedure Laterality Date  . Laparoscopy  2/99, 12/12    AND ABLASION  . Robotic assisted total hysterectomy N/A 11/10/2012    Procedure: ROBOTIC ASSISTED TOTAL HYSTERECTOMY;  Surgeon: Lovenia Kim, MD;  Location: [facility];  Service: Gynecology;  Laterality: N/A;  3 hrs.  . Bilateral salpingectomy Bilateral 11/10/2012    Procedure: BILATERAL SALPINGECTOMY;  Surgeon: Lovenia Kim, MD;  Location: [facility];  Service: Gynecology;  Laterality: Bilateral;   Allergies  Allergies  Allergen Reactions  . Latex   . Promethazine Hcl   . Adhesive [Tape] Rash   HPI  37 yo with prior medical history of anemia and B12 deficiency that underwent hysterectomy about a year ago. The patient has history of chronic fatigue for which she has been evaluated without any significant abnormality being found. Her fatigue has improved a little but since her vitamin B-12 has been replaced however she still feels significantly tired and her some days she can't even make it out of the couch or to keep her eyes open. At the baseline the patient is very active and is trying to exercise every day. She is waking up at 5 am is running approximately 10 miles a week. She states that as she exercises her fatigue actually improves. About 2 times a week she goes to the gym and exercises for about 45 minutes. She has a full-time job as a Freight forwarder and she has a sick child with diabetes for which she  wakes up every night to check his blood sugar. She otherwise doesn't have significant past medical history, and denies any prior hypertension or hyperlipidemia. Her mother just recently had a pacemaker implanted for significant bradycardia (the patient doesn't know further details), she was symptomatic with profound fatigue and and dizziness. Her father just died recently of a massive heart attack is her first presentation of coronary artery disease. As a consequence all of the siblings are getting checked. The patient denies any chest pain or prior episode of syncope.  She is also suffering from anxiety and was prescribed Ativan in the past which she uses occasionally but she rather goes for one to relieve her anxiety.   Home Medications  Prior to Admission medications   Medication Sig Start Date End Date Taking? Authorizing Provider  ALPRAZolam (XANAX) 0.25 MG tablet Take 1 tablet (0.25 mg total) by mouth 2 (two) times daily as needed for anxiety. 12/18/13 12/18/14 Yes Melissa R Smith, PA-C  Cyanocobalamin (VITAMIN B 12 PO) Take by mouth daily.   Yes Historical Provider, MD    Family History  Family History  Problem Relation Age of Onset  . Breast cancer Paternal Aunt   . Colon cancer Paternal Grandfather     70'S  . Colon polyps Father   . Celiac disease Brother   . Diabetes Son   . Gastric cancer Maternal Grandfather   . Celiac disease Son   .  Colon cancer      aunt  . Heart attack Father     Social History  History   Social History  . Marital Status: Married    Spouse Name: N/A    Number of Children: 2  . Years of Education: N/A   Occupational History  . Government social research officer    Social History Main Topics  . Smoking status: Never Smoker   . Smokeless tobacco: Never Used  . Alcohol Use: Yes     Comment: 1 DRINK EVERY 2-3 MONTHS  . Drug Use: No  . Sexual Activity: Not on file   Other Topics Concern  . Not on file   Social History Narrative  . No narrative on file       Review of Systems, as per HPI, otherwise negative General:  No chills, fever, night sweats or weight changes.  Cardiovascular:  No chest pain, dyspnea on exertion, edema, orthopnea, palpitations, paroxysmal nocturnal dyspnea. Dermatological: No rash, lesions/masses Respiratory: No cough, dyspnea Urologic: No hematuria, dysuria Abdominal:   No nausea, vomiting, diarrhea, bright red blood per rectum, melena, or hematemesis Neurologic:  No visual changes, wkns, changes in mental status. All other systems reviewed and are otherwise negative except as noted above.  Physical Exam  Blood pressure 110/68, pulse 59, height 5' 5.5" (1.664 m), weight 153 lb (69.4 kg), last menstrual period 10/21/2012, SpO2 99.00%.  General: Pleasant, NAD Psych: Normal affect. Neuro: Alert and oriented X 3. Moves all extremities spontaneously. HEENT: Normal  Neck: Supple without bruits or JVD. Lungs:  Resp regular and unlabored, CTA. Heart: RRR no s3, s4, or murmurs. Abdomen: Soft, non-tender, non-distended, BS + x 4.  Extremities: No clubbing, cyanosis or edema. DP/PT/Radials 2+ and equal bilaterally.  Labs:  No results found for this basename: CKTOTAL, CKMB, TROPONINI,  in the last 72 hours Lab Results  Component Value Date   WBC 5.8 03/01/2014   HGB 14.4 03/01/2014   HCT 41.3 03/01/2014   MCV 86.6 03/01/2014   PLT 213 03/01/2014       Component Value Date/Time   NA 137 03/01/2014 1131   K 4.3 03/01/2014 1131   CL 103 03/01/2014 1131   CO2 26 03/01/2014 1131   GLUCOSE 85 03/01/2014 1131   BUN 14 03/01/2014 1131   CREATININE 0.83 03/01/2014 1131   CREATININE 0.77 11/10/2012 1859   CALCIUM 9.7 03/01/2014 1131   PROT 6.8 02/02/2014 1026   ALBUMIN 4.3 02/02/2014 1026   AST 14 02/02/2014 1026   ALT 11 02/02/2014 1026   ALKPHOS 36* 02/02/2014 1026   BILITOT 2.3* 02/02/2014 1026   GFRNONAA 89 03/01/2014 1131   GFRNONAA >90 11/10/2012 1859   GFRAA >89 03/01/2014 1131   GFRAA >90 11/10/2012 1859   Lab Results   Component Value Date   CHOL 164 02/02/2014   HDL 67 02/02/2014   LDLCALC 85 02/02/2014   TRIG 59 02/02/2014    Accessory Clinical Findings  Echocardiogram - none  ECG - sinus bradycardia, 49 beats per minute, rightward axis, incomplete right bundle branch block.    Assessment & Plan  A very pleasant 38 year old female who is coming complaining of profound fatigue, lack of energy occasional dyspnea on exertion and bradycardia. Patient had set of labs performed with TSH and free T4 being normal as well as his electrolytes and kidney function. She has elevation direct bilirubin that might suggest biliary obstruction. The patient is asymptomatic.  We'll check a cortisol level. Her bradycardia is concerning and it  might be a sign of just excellent exercise capacity with high vagal tone however at the same time it might be a sign of chronotropic incompetence that might be causing her fatigue. Therefore we will schedule a 24-hour Holter monitor to evaluate for heart rate during the day, more significant AV blocks. The patient is advised to exercise while wearing heart monitor. We will also order an echocardiogram to rule out any structural heart disease. We'll also order an exercise treadmill stress test to evaluate for chronotropic incompetence, rule out any ischemia, and evaluate for blood pressure response to stress as her blood pressure has been borderline today and she states she has never had elevated blood pressure before.  The patient had a normal echocardiogram and normal 48 hour Holter monitor. Treadmill stress test showed excellent exercise capacity, no suggestions or ischemia, no evidence of chronotropic incompetence and no arrhythmias. This proves that her bradycardia is secondary to good functional capacity and increased vagal tone and no treatment is necessary at this time. Her cortisol level was normal and so was TSH.  Follow up in 1 year.  Dorothy Spark, MD, [facility] 03/10/2014,  9:52 AM

## 2014-03-10 NOTE — Telephone Encounter (Signed)
S/W PATIENT AND GAVE NP APPT FOR 07/14 @ 11 W/DR. Eddington. REFERRING MELISSA SMITH DX- OTHER ABN CLINICAL FINDING; LYMPHADENOPATHY OF HEAD AND NECK REGION WELCOME PACKET MAILED.

## 2014-03-10 NOTE — Patient Instructions (Signed)
Your physician recommends that you continue on your current medications as directed. Please refer to the Current Medication list given to you today.   Your physician wants you to follow-up in: ONE YEAR WITH DR NELSON You will receive a reminder letter in the mail two months in advance. If you don't receive a letter, please call our office to schedule the follow-up appointment.  

## 2014-03-23 ENCOUNTER — Encounter: Payer: Self-pay | Admitting: Hematology and Oncology

## 2014-03-23 ENCOUNTER — Telehealth: Payer: Self-pay | Admitting: Hematology and Oncology

## 2014-03-23 ENCOUNTER — Ambulatory Visit (HOSPITAL_BASED_OUTPATIENT_CLINIC_OR_DEPARTMENT_OTHER): Payer: BC Managed Care – PPO

## 2014-03-23 ENCOUNTER — Ambulatory Visit (HOSPITAL_BASED_OUTPATIENT_CLINIC_OR_DEPARTMENT_OTHER): Payer: BC Managed Care – PPO | Admitting: Hematology and Oncology

## 2014-03-23 VITALS — BP 136/88 | HR 53 | Temp 97.9°F | Resp 18 | Ht 65.0 in | Wt 154.9 lb

## 2014-03-23 DIAGNOSIS — R591 Generalized enlarged lymph nodes: Secondary | ICD-10-CM | POA: Insufficient documentation

## 2014-03-23 DIAGNOSIS — R599 Enlarged lymph nodes, unspecified: Secondary | ICD-10-CM

## 2014-03-23 HISTORY — DX: Generalized enlarged lymph nodes: R59.1

## 2014-03-23 NOTE — Progress Notes (Signed)
Checked in new patient with no financial issues. She has appt card and has not been out of [place]

## 2014-03-23 NOTE — Telephone Encounter (Signed)
Pt sched with Dr. Constance Holster on 8.4 @ 1pm...printed pt avs

## 2014-03-23 NOTE — Assessment & Plan Note (Signed)
I am concerned about potential diagnosis of lymphoma. I will refer her to ENT surgeon for excisional lymph node biopsy. She agreed to proceed.

## 2014-03-23 NOTE — Progress Notes (Signed)
[facility] NOTE  Patient Care Team: Unk Pinto, MD as PCP - General (Internal Medicine) Unk Pinto, MD as PCP - Internal Medicine (Internal Medicine)  CHIEF COMPLAINTS/PURPOSE OF CONSULTATION:  Progressive lymphadenopathy  HISTORY OF PRESENTING ILLNESS:  Brenda Bennett 38 y.o. female is here because of aggressive lymphadenopathy affecting the left side of the neck. She first palpated a lump on the left supraclavicular area around 2012. It does not bother her. Recently, she palpated a new lump, close to the left sternocleidomastoid muscle area. This lump has not changed in size and is not causing any pain. She underwent further evaluation with imaging study which confirmed diagnosis of a lymphadenopathy. Observation was recommended but the patient was subsequently referred here for second opinion. Denies recent infection in her left lower extremity or around the head and neck region. Denies chills, night sweats, anorexia or abnormal weight loss. Denies lymphadenopathy elsewhere.  MEDICAL HISTORY:  Past Medical History  Diagnosis Date  . IBS (irritable bowel syndrome)   . UTI (lower urinary tract infection)   . Hyperbilirubinemia   . Endometriosis   . PONV (postoperative nausea and vomiting)     Transderm patch with last surg. worked "perfectly"  . B12 deficiency   . Right ovarian cyst   . Lymphadenopathy of head and neck 03/23/2014    SURGICAL HISTORY: Past Surgical History  Procedure Laterality Date  . Laparoscopy  2/99, 12/12    AND ABLASION  . Robotic assisted total hysterectomy N/A 11/10/2012    Procedure: ROBOTIC ASSISTED TOTAL HYSTERECTOMY;  Surgeon: Lovenia Kim, MD;  Location: [facility];  Service: Gynecology;  Laterality: N/A;  3 hrs.  . Bilateral salpingectomy Bilateral 11/10/2012    Procedure: BILATERAL SALPINGECTOMY;  Surgeon: Lovenia Kim, MD;  Location: [facility];  Service: Gynecology;  Laterality: Bilateral;  . Abdominal  hysterectomy      SOCIAL HISTORY: History   Social History  . Marital Status: Married    Spouse Name: N/A    Number of Children: 2  . Years of Education: N/A   Occupational History  . Government social research officer    Social History Main Topics  . Smoking status: Never Smoker   . Smokeless tobacco: Never Used  . Alcohol Use: Yes     Comment: 1 DRINK EVERY 2-3 MONTHS  . Drug Use: No  . Sexual Activity: Not on file   Other Topics Concern  . Not on file   Social History Narrative  . No narrative on file    FAMILY HISTORY: Family History  Problem Relation Age of Onset  . Breast cancer Paternal Aunt   . Colon cancer Paternal Grandfather     70'S  . Colon polyps Father   . Celiac disease Brother   . Diabetes Son   . Gastric cancer Maternal Grandfather   . Celiac disease Son   . Colon cancer      aunt  . Heart attack Father     ALLERGIES:  is allergic to latex; promethazine hcl; and adhesive.  MEDICATIONS:  Current Outpatient Prescriptions  Medication Sig Dispense Refill  . ALPRAZolam (XANAX) 0.25 MG tablet Take 1 tablet (0.25 mg total) by mouth 2 (two) times daily as needed for anxiety.  60 tablet  0  . Cyanocobalamin (VITAMIN B 12 PO) Take by mouth daily.       No current facility-administered medications for this visit.    REVIEW OF SYSTEMS:   Eyes: Denies blurriness of vision, double vision or  watery eyes Ears, nose, mouth, throat, and face: Denies mucositis or sore throat Respiratory: Denies cough, dyspnea or wheezes Cardiovascular: Denies palpitation, chest discomfort or lower extremity swelling Gastrointestinal:  Denies nausea, heartburn or change in bowel habits Skin: Denies abnormal skin rashes Neurological:Denies numbness, tingling or new weaknesses Behavioral/Psych: Mood is stable, no new changes  All other systems were reviewed with the patient and are negative.  PHYSICAL EXAMINATION: ECOG PERFORMANCE STATUS: 0 - Asymptomatic  Filed Vitals:   03/23/14  1114  BP: 136/88  Pulse: 53  Temp: 97.9 F (36.6 C)  Resp: 18   Filed Weights   03/23/14 1114  Weight: 154 lb 14.4 oz (70.262 kg)    GENERAL:alert, no distress and comfortable SKIN: skin color, texture, turgor are normal, no rashes or significant lesions. Multiple skin moles were noted but none of them were malignant. EYES: normal, conjunctiva are pink and non-injected, sclera clear OROPHARYNX:no exudate, no erythema and lips, buccal mucosa, and tongue normal  NECK: supple, thyroid normal size, non-tender, without nodularity LYMPH:  probable lymphadenopathy on the left supraclavicular and left sternomastoid region, non-elsewhere. LUNGS: clear to auscultation and percussion with normal breathing effort HEART: regular rate & rhythm and no murmurs and no lower extremity edema ABDOMEN:abdomen soft, non-tender and normal bowel sounds Musculoskeletal:no cyanosis of digits and no clubbing  PSYCH: alert & oriented x 3 with fluent speech NEURO: no focal motor/sensory deficits  LABORATORY DATA:  I have reviewed the data as listed Lab Results  Component Value Date   WBC 5.8 03/01/2014   HGB 14.4 03/01/2014   HCT 41.3 03/01/2014   MCV 86.6 03/01/2014   PLT 213 03/01/2014    RADIOGRAPHIC STUDIES: I have personally reviewed the radiological images as listed and agreed with the findings in the report. Ct Soft Tissue Neck W Contrast  03/05/2014   CLINICAL DATA:  lymphadenopathy, left neck mass  EXAM: CT NECK WITH CONTRAST  TECHNIQUE: Multidetector CT imaging of the neck was performed using the standard protocol following the bolus administration of intravenous contrast.  CONTRAST:  73mL OMNIPAQUE IOHEXOL 300 MG/ML  SOLN  COMPARISON:  05/10/2011  FINDINGS: There is good vascular enhancement. Bovine variant aortic arch anatomy. In the marked area of palpable concern is a subcutaneous lymph node 7 mm short axis diameter just lateral to the left external jugular vein image 48/3 (4 mm diameter on prior  scan). No pathologically enlarged lymph nodes. Negative for mass, focal inflammatory process, or abscess. Visualized lung apices are clear. Multiple dental restorations. Regional bones unremarkable. No prevertebral soft tissue swelling or retropharyngeal gas.  IMPRESSION: 1. Solitary 7 mm subcutaneous lymph node corresponding to palpable region.   Electronically Signed   By: Arne Cleveland M.D.   On: 03/05/2014 12:28    ASSESSMENT & PLAN:  Lymphadenopathy of head and neck I am concerned about potential diagnosis of lymphoma. I will refer her to ENT surgeon for excisional lymph node biopsy. She agreed to proceed.    Orders Placed This Encounter  Procedures  . Ambulatory referral to ENT    Referral Priority:  Routine    Referral Type:  Consultation    Referral Reason:  Specialty Services Required    Requested Specialty:  Otolaryngology    Number of Visits Requested:  1    All questions were answered. The patient knows to call the clinic with any problems, questions or concerns. I spent 30 minutes counseling the patient face to face. The total time spent in the appointment was 40 minutes  and more than 50% was on counseling.     [facility], [place], MD 03/23/2014 8:39 PM

## 2014-03-23 NOTE — Telephone Encounter (Signed)
Pt sched with Dr. Constance Holster on 8.4 @ 1pm...printed avs for pt

## 2014-06-25 ENCOUNTER — Other Ambulatory Visit: Payer: Self-pay

## 2014-12-17 ENCOUNTER — Encounter: Payer: Self-pay | Admitting: Internal Medicine

## 2015-01-06 ENCOUNTER — Ambulatory Visit (INDEPENDENT_AMBULATORY_CARE_PROVIDER_SITE_OTHER)
Admission: RE | Admit: 2015-01-06 | Discharge: 2015-01-06 | Disposition: A | Discharge status: Home / Self Care | Payer: Self-pay | Source: Ambulatory Visit | Attending: Cardiology | Admitting: Cardiology

## 2015-01-06 ENCOUNTER — Ambulatory Visit (INDEPENDENT_AMBULATORY_CARE_PROVIDER_SITE_OTHER): Payer: BLUE CROSS/BLUE SHIELD | Admitting: Cardiology

## 2015-01-06 ENCOUNTER — Encounter: Payer: Self-pay | Admitting: Cardiology

## 2015-01-06 VITALS — BP 120/84 | HR 74 | Ht 65.0 in | Wt 155.0 lb

## 2015-01-06 DIAGNOSIS — R079 Chest pain, unspecified: Secondary | ICD-10-CM

## 2015-01-06 DIAGNOSIS — R001 Bradycardia, unspecified: Secondary | ICD-10-CM | POA: Diagnosis not present

## 2015-01-06 DIAGNOSIS — Z8249 Family history of ischemic heart disease and other diseases of the circulatory system: Secondary | ICD-10-CM | POA: Diagnosis not present

## 2015-01-06 NOTE — Progress Notes (Signed)
Patient ID: MELORA MENON, female   DOB: 05-06-1976, 39 y.o.   MRN: [id number]    Patient Name: Brenda Bennett Date of Encounter: 01/06/2015  Primary Care Provider:  Alesia Richards, MD Primary Cardiologist:  Dorothy Spark  Problem List   Past Medical History  Diagnosis Date  . IBS (irritable bowel syndrome)   . UTI (lower urinary tract infection)   . Hyperbilirubinemia   . Endometriosis   . PONV (postoperative nausea and vomiting)     Transderm patch with last surg. worked "perfectly"  . B12 deficiency   . Right ovarian cyst   . Lymphadenopathy of head and neck 03/23/2014   Past Surgical History  Procedure Laterality Date  . Laparoscopy  2/99, 12/12    AND ABLASION  . Robotic assisted total hysterectomy N/A 11/10/2012    Procedure: ROBOTIC ASSISTED TOTAL HYSTERECTOMY;  Surgeon: Lovenia Kim, MD;  Location: [facility];  Service: Gynecology;  Laterality: N/A;  3 hrs.  . Bilateral salpingectomy Bilateral 11/10/2012    Procedure: BILATERAL SALPINGECTOMY;  Surgeon: Lovenia Kim, MD;  Location: [facility];  Service: Gynecology;  Laterality: Bilateral;  . Abdominal hysterectomy     Allergies  Allergies  Allergen Reactions  . Latex   . Promethazine Hcl   . Adhesive [Tape] Rash   Chief complain: Chest pain, bradycardia  HPI  39 yo with prior medical history of anemia and B12 deficiency that underwent hysterectomy about a year ago. The patient has history of chronic fatigue for which she has been evaluated without any significant abnormality being found. Her fatigue has improved a little but since her vitamin B-12 has been replaced however she still feels significantly tired and her some days she can't even make it out of the couch or to keep her eyes open. At the baseline the patient is very active and is trying to exercise every day. She is waking up at 5 am is running approximately 10 miles a week. She states that as she exercises her fatigue actually improves. About 2  times a week she goes to the gym and exercises for about 45 minutes. She has a full-time job as a Freight forwarder and she has a sick child with diabetes for which she wakes up every night to check his blood sugar. She otherwise doesn't have significant past medical history, and denies any prior hypertension or hyperlipidemia. Her mother just recently had a pacemaker implanted for significant bradycardia (the patient doesn't know further details), she was symptomatic with profound fatigue and and dizziness. Her father just died recently of a massive heart attack is her first presentation of coronary artery disease. As a consequence all of the siblings are getting checked. The patient denies any chest pain or prior episode of syncope.  She is also suffering from anxiety and was prescribed Ativan in the past which she uses occasionally but she rather goes for one to relieve her anxiety.  01/06/2015 - the patient is coming after one year, she states she continues to have episodes of chest pain, the last one started a few years ago and she still feels some pressure. While in our office and referred performed her EKG that shows sinus bradycardia with incomplete right bundle branch block otherwise completely normal EKG and unchanged from last year. Her main concern is that her father died of sudden cardiac death secondary to coronary artery disease with no prior known heart disease, and her mom has a pacemaker. She is very active, pushes herself and  doesn't experience any chest pain, shortness of breath dizziness or palpitations while she is exercising. No syncope. She continues to feel profoundly tired, she was evaluated for lymphadenopathy at oncology clinic last year for potential lymphoma that was not confirmed. She is concerned about potential leukemia as her grandmother was diagnosed with that.   Home Medications  Prior to Admission medications   Medication Sig Start Date End Date Taking? Authorizing Provider    ALPRAZolam (XANAX) 0.25 MG tablet Take 1 tablet (0.25 mg total) by mouth 2 (two) times daily as needed for anxiety. 12/18/13 12/18/14 Yes Melissa R Smith, PA-C  Cyanocobalamin (VITAMIN B 12 PO) Take by mouth daily.   Yes Historical Provider, MD    Family History  Family History  Problem Relation Age of Onset  . Breast cancer Paternal Aunt   . Colon cancer Paternal Grandfather     70'S  . Colon polyps Father   . Heart attack Father   . Celiac disease Brother   . Diabetes Son   . Gastric cancer Maternal Grandfather   . Celiac disease Son   . Colon cancer      aunt  . Other Mother     Pacemaker for low heart rate    Social History  History   Social History  . Marital Status: Married    Spouse Name: N/A  . Number of Children: 2  . Years of Education: N/A   Occupational History  . Government social research officer    Social History Main Topics  . Smoking status: Never Smoker   . Smokeless tobacco: Never Used  . Alcohol Use: Yes     Comment: 1 DRINK EVERY 2-3 MONTHS  . Drug Use: No  . Sexual Activity: Not on file   Other Topics Concern  . Not on file   Social History Narrative     Review of Systems, as per HPI, otherwise negative General:  No chills, fever, night sweats or weight changes.  Cardiovascular:  No chest pain, dyspnea on exertion, edema, orthopnea, palpitations, paroxysmal nocturnal dyspnea. Dermatological: No rash, lesions/masses Respiratory: No cough, dyspnea Urologic: No hematuria, dysuria Abdominal:   No nausea, vomiting, diarrhea, bright red blood per rectum, melena, or hematemesis Neurologic:  No visual changes, wkns, changes in mental status. All other systems reviewed and are otherwise negative except as noted above.  Physical Exam  Blood pressure 120/84, pulse 74, height 5\' 5"  (1.651 m), weight 155 lb (70.308 kg), last menstrual period 10/21/2012.  General: Pleasant, NAD Psych: Normal affect. Neuro: Alert and oriented X 3. Moves all extremities  spontaneously. HEENT: Normal  Neck: Supple without bruits or JVD. Lungs:  Resp regular and unlabored, CTA. Heart: RRR no s3, s4, or murmurs. Abdomen: Soft, non-tender, non-distended, BS + x 4.  Extremities: No clubbing, cyanosis or edema. DP/PT/Radials 2+ and equal bilaterally.  Labs:  No results for input(s): CKTOTAL, CKMB, TROPONINI in the last 72 hours. Lab Results  Component Value Date   WBC 5.8 03/01/2014   HGB 14.4 03/01/2014   HCT 41.3 03/01/2014   MCV 86.6 03/01/2014   PLT 213 03/01/2014       Component Value Date/Time   NA 137 03/01/2014 1131   K 4.3 03/01/2014 1131   CL 103 03/01/2014 1131   CO2 26 03/01/2014 1131   GLUCOSE 85 03/01/2014 1131   BUN 14 03/01/2014 1131   CREATININE 0.83 03/01/2014 1131   CREATININE 0.77 11/10/2012 1859   CALCIUM 9.7 03/01/2014 1131   PROT 6.8 02/02/2014 1026  ALBUMIN 4.3 02/02/2014 1026   AST 14 02/02/2014 1026   ALT 11 02/02/2014 1026   ALKPHOS 36* 02/02/2014 1026   BILITOT 2.3* 02/02/2014 1026   GFRNONAA 89 03/01/2014 1131   GFRNONAA >90 11/10/2012 1859   GFRAA >89 03/01/2014 1131   GFRAA >90 11/10/2012 1859   Lab Results  Component Value Date   CHOL 164 02/02/2014   HDL 67 02/02/2014   LDLCALC 85 02/02/2014   TRIG 59 02/02/2014    Accessory Clinical Findings  Echocardiogram - none  ECG - sinus bradycardia, 49 beats per minute, rightward axis, incomplete right bundle branch block.    Assessment & Plan  A very pleasant 40 year old female who is coming complaining of profound fatigue, lack of energy occasional dyspnea on exertion and bradycardia. Patient had set of labs performed with TSH and free T4 being normal as well as his electrolytes and kidney function. She has elevation direct bilirubin that might suggest biliary obstruction. Her cortisol level was also normal.. Her bradycardia is concerning and it might be a sign of just excellent exercise capacity with high vagal tone however at the same time it might  be a sign of chronotropic incompetence that might be causing her fatigue. I reviewed again her last year Holter monitor and in fact she has really good chronotropic response to exercise, she is only bradycardic at rest or at night but her heart rate goes up to 150 when she exercises. 24-hour Holter monitor showed the longest pause of 1.8 second that is not concerning. There are no arrhythmias whatsoever. Treadmill stress test showed excellent exercise capacity, no suggestions or ischemia, no evidence of chronotropic incompetence and no arrhythmias. This proves that her bradycardia is secondary to good functional capacity and increased vagal tone and no treatment is necessary at this time.   Today we discussed her family history of premature coronary artery disease and sudden cardiac death, and reperformed calcium score scan in the office. Calcium score scan is a strong prognostic tool that provides reclassification as of patient's with borderline cholesterol and strong family history of coronary artery disease. Her calcium score performed today was 0 and it reclassified patient in a very low risk category and in fact she doesn't need any statin therapy as this point.  She is advised to follow with oncology clinic for her concern of hematology problem at this point we don't need any additional cardiac workup.  Follow up in 1 year.  Dorothy Spark, MD, [facility] 01/06/2015, 11:19 AM

## 2015-01-06 NOTE — Patient Instructions (Signed)
Your physician recommends that you continue on your current medications as directed. Please refer to the Current Medication list given to you today.     DR NELSON HAS ORDERED FOR YOU TO HAVE A CALCIUM SCORE DONE TODAY IN OUR OFFICE.  DR Meda Coffee WILL CONTACT YOU WITH THESE RESULTS TONIGHT.      Your physician recommends that you schedule a follow-up appointment in: BASED ON YOUR CALCIUM SCORE

## 2015-07-14 ENCOUNTER — Ambulatory Visit (INDEPENDENT_AMBULATORY_CARE_PROVIDER_SITE_OTHER): Payer: BLUE CROSS/BLUE SHIELD | Admitting: Internal Medicine

## 2015-07-14 ENCOUNTER — Encounter: Payer: Self-pay | Admitting: Internal Medicine

## 2015-07-14 VITALS — BP 106/78 | HR 56 | Temp 97.8°F | Resp 16 | Ht 65.5 in | Wt 157.0 lb

## 2015-07-14 DIAGNOSIS — J069 Acute upper respiratory infection, unspecified: Secondary | ICD-10-CM

## 2015-07-14 MED ORDER — PREDNISONE 20 MG PO TABS: ORAL_TABLET | ORAL | Status: DC

## 2015-07-14 MED ORDER — AZITHROMYCIN 250 MG PO TABS: ORAL_TABLET | ORAL | Status: DC

## 2015-07-14 MED ORDER — BENZONATATE 200 MG PO CAPS: 200.0000 mg | ORAL_CAPSULE | Freq: Three times a day (TID) | ORAL | Status: DC | PRN

## 2015-07-14 NOTE — Progress Notes (Signed)
Patient ID: Brenda Bennett, female   DOB: [birth year], 39 y.o.   MRN: [id number]  HPI  Patient presents to the office for evaluation of cough.  It has been going on for 6 days.  Patient reports night > day, dry, barky, worse with lying down, no sputum production.  They also endorse change in voice, chills, postnasal drip and right ear pain, sore throat, congestion, clear rhinorrhea, sneezing..  They have tried sudafed.  They report that nothing has worked.  They denies other sick contacts.  She does normally have some bad seasonal allergies.    Review of Systems  Constitutional: Positive for chills. Negative for fever.  HENT: Positive for congestion, ear pain and sore throat.   Eyes: Negative.   Respiratory: Positive for cough. Negative for shortness of breath and wheezing.   Cardiovascular: Negative for chest pain, palpitations and leg swelling.  Neurological: Positive for headaches.    PE:  Filed Vitals:   07/14/15 1113  BP: 106/78  Pulse: 56  Temp: 97.8 F (36.6 C)  Resp: 16    General:  Alert and non-toxic, WDWN, NAD HEENT: NCAT, PERLA, EOM normal, no occular discharge or erythema.  Nasal mucosal edema with sinus tenderness to palpation.  Oropharynx clear with minimal oropharyngeal edema and erythema.  Mucous membranes moist and pink. Neck:  Cervical adenopathy Chest:  RRR no MRGs.  Lungs clear to auscultation A&P with no wheezes rhonchi or rales.   Abdomen: +BS x 4 quadrants, soft, non-tender, no guarding, rigidity, or rebound. Skin: warm and dry no rash Neuro: A&Ox4, CN II-XII grossly intact  Assessment and Plan:   1. Acute URI -prednisone -zpak -zyrtec -nasal saline -tessalon prn for cough -flonase

## 2015-07-14 NOTE — Patient Instructions (Signed)
Please take the prednisone as prescribed and finish it.    Please use saline as often as you can tolerate.   Please take claritin/allegra/or zyrtec store brands are okay once daily to help dry up congestion.  You can use 2 sprays per nostril of flonase, nasacort, or rhinocort to help dry up congestion as well.  Use right before bedtime.  Don't blow your nose for at least 30 minutes.  Please take tessalon as needed for coughing.  If you are not significantly better in 3 days go ahead and take the zpak.

## 2016-04-23 ENCOUNTER — Ambulatory Visit (INDEPENDENT_AMBULATORY_CARE_PROVIDER_SITE_OTHER): Payer: BLUE CROSS/BLUE SHIELD | Admitting: Physician Assistant

## 2016-04-23 ENCOUNTER — Encounter: Payer: Self-pay | Admitting: Physician Assistant

## 2016-04-23 VITALS — BP 122/78 | HR 62 | Ht 63.5 in | Wt 153.2 lb

## 2016-04-23 DIAGNOSIS — R001 Bradycardia, unspecified: Secondary | ICD-10-CM | POA: Diagnosis not present

## 2016-04-23 DIAGNOSIS — R5382 Chronic fatigue, unspecified: Secondary | ICD-10-CM | POA: Diagnosis not present

## 2016-04-23 NOTE — Progress Notes (Signed)
Cardiology Office Note    Date:  04/23/2016   ID:  Brenda Bennett, DOB June 13, 1976, MRN [id number]  PCP:  Alesia Richards, MD  Cardiologist:   Chief Complaint  Patient presents with  . Fatigue    History of Present Illness:  Brenda Bennett is a 40 y.o. female  with strong family history of premature coronary disease and sudden cardiac death. She is followed by Dr. Meda Coffee for occasional dyspnea on exertion and bradycardia. It's felt she has excellent exercise capacity with high vagal tone. Holter monitor showed really good chronotropic response to exercise and she is only bradycardic at rest or at night but her heart rate goes up to 150 when she exercises. 24-hour Holter monitor showed the longest +1.8 seconds that is not concerning. No arrhythmias whatsoever. Treadmill test showed excellent exercise capacity no suggestions of ischemia no evidence of chronotropic incompetence or arrhythmia.  Complains of fatigue and doesn't feel like doing anything. Started 3 years ago and may have gotten worse over the past year. Does boot camp 3 days a week. Runs/walks 4-5 miles twice a week. Monitors heart rate and has been as low as 31 and 37 bpm based on her Smart watch.. Once in the middle of exercising and heart rate dropped from 76 to 37. Seeing a new primary physician tomorrow for a complete workup of her fatigue once again.   Past Medical History:  Diagnosis Date  . B12 deficiency   . Endometriosis   . Hyperbilirubinemia   . IBS (irritable bowel syndrome)   . Lymphadenopathy of head and neck 03/23/2014  . PONV (postoperative nausea and vomiting)    Transderm patch with last surg. worked "perfectly"  . Right ovarian cyst   . UTI (lower urinary tract infection)     Past Surgical History:  Procedure Laterality Date  . ABDOMINAL HYSTERECTOMY    . BILATERAL SALPINGECTOMY Bilateral 11/10/2012   Procedure: BILATERAL SALPINGECTOMY;  Surgeon: Lovenia Kim, MD;  Location: [facility];   Service: Gynecology;  Laterality: Bilateral;  . LAPAROSCOPY  2/99, 12/12   AND ABLASION  . ROBOTIC ASSISTED TOTAL HYSTERECTOMY N/A 11/10/2012   Procedure: ROBOTIC ASSISTED TOTAL HYSTERECTOMY;  Surgeon: Lovenia Kim, MD;  Location: [facility];  Service: Gynecology;  Laterality: N/A;  3 hrs.    Current Medications: Outpatient Medications Prior to Visit  Medication Sig Dispense Refill  . azithromycin (ZITHROMAX Z-PAK) 250 MG tablet 2 po day one, then 1 daily x 4 days (Patient not taking: Reported on 04/23/2016) 5 tablet 0  . benzonatate (TESSALON) 200 MG capsule Take 1 capsule (200 mg total) by mouth 3 (three) times daily as needed for cough. (Patient not taking: Reported on 04/23/2016) 30 capsule 1  . predniSONE (DELTASONE) 20 MG tablet 3 tabs po day one, then 2 tabs daily x 4 days (Patient not taking: Reported on 04/23/2016) 11 tablet 0   No facility-administered medications prior to visit.      Allergies:   Latex; Promethazine hcl; and Adhesive [tape]   Social History   Social History  . Marital status: Married    Spouse name: N/A  . Number of children: 2  . Years of education: N/A   Occupational History  . Project Manager [employer]    Social History Main Topics  . Smoking status: Never Smoker  . Smokeless tobacco: Never Used  . Alcohol use Yes     Comment: 1 DRINK EVERY 2-3 MONTHS  . Drug use: No  .  Sexual activity: Not Asked   Other Topics Concern  . None   Social History Narrative  . None     Family History:  The patient's  family history includes Breast cancer in her paternal aunt; Celiac disease in her brother and son; Colon cancer in her paternal grandfather; Colon polyps in her father; Diabetes in her son; Gastric cancer in her maternal grandfather; Heart attack in her father; Other in her mother.   ROS:   Please see the history of present illness.    Review of Systems  Constitution: Positive for malaise/fatigue.  HENT: Negative.   Eyes: Negative.     Cardiovascular: Negative.   Respiratory: Negative.   Hematologic/Lymphatic: Negative.   Musculoskeletal: Negative.  Negative for joint pain.  Gastrointestinal: Negative.   Genitourinary: Negative.   Neurological: Negative.    All other systems reviewed and are negative.   PHYSICAL EXAM:   VS:  BP 122/78   Pulse 62   Ht 5' 3.5" (1.613 m)   Wt 153 lb 3.2 oz (69.5 kg)   LMP 10/21/2012   BMI 26.71 kg/m   Physical Exam  GEN: Well nourished, well developed, in no acute distress   Neck: no JVD, carotid bruits, or masses Cardiac:RRR; no murmurs, rubs, or gallops  Respiratory:  clear to auscultation bilaterally, normal work of breathing GI: soft, nontender, nondistended, + BS Ext: without cyanosis, clubbing, or edema, Good distal pulses bilaterally MS: no deformity or atrophy  Skin: warm and dry, no rash Psych: euthymic mood, full affect  Wt Readings from Last 3 Encounters:  04/23/16 153 lb 3.2 oz (69.5 kg)  07/14/15 157 lb (71.2 kg)  01/06/15 155 lb (70.3 kg)      Studies/Labs Reviewed:   EKG:  EKG is  ordered today.  The ekg ordered today demonstrates Normal sinus rhythm with incomplete right bundle branch block at 63 bpm, no change from EKG in 12/2014  Recent Labs: No results found for requested labs within last 8760 hours.   Lipid Panel    Component Value Date/Time   CHOL 164 02/02/2014 1026   TRIG 59 02/02/2014 1026   HDL 67 02/02/2014 1026   CHOLHDL 2.4 02/02/2014 1026   VLDL 12 02/02/2014 1026   LDLCALC 85 02/02/2014 1026    Additional studies/ records that were reviewed today include:  2-D echo 02/2014 Study Conclusions  - Left ventricle: The cavity size was normal. Systolic function was   normal. The estimated ejection fraction was in the range of 55%   to 60%. Wall motion was normal; there were no regional wall   motion abnormalities. Left ventricular diastolic function   parameters were normal.  IMPRESSION: Coronary calcium score of 0. This was 0  percentile for age and sex matched control.   Ena Dawley     Electronically Signed   By: Ena Dawley   On: 01/06/2015 13:45      ASSESSMENT:    1. Bradycardia   2. Chronic fatigue      PLAN:  In order of problems listed above:  Bradycardia: Patient has noticed bradycardia on her Smart watch and is concerned that it's causing her chronic fatigue. Her mother also had a pacemaker. We'll order an event monitor to hopefully document any bradycardia arrhythmias. Follow-up with Dr. Meda Coffee in one month.  Chronic fatigue for the past 3 years with full workup in the past. Seeing a new primary care tomorrow. Will not do labs today as she'll have them tomorrow. Have requested copies  to be sent to [place].    Medication Adjustments/Labs and Tests Ordered: Current medicines are reviewed at length with the patient today.  Concerns regarding medicines are outlined above.  Medication changes, Labs and Tests ordered today are listed in the Patient Instructions below. Patient Instructions  Medication Instructions:  Your physician recommends that you continue on your current medications as directed. Please refer to the Current Medication list given to you today.   Labwork: NONE  Testing/Procedures: 1. Your physician has recommended that you wear an event monitor. Event monitors are medical devices that record the heart's electrical activity. Doctors most often [place] these monitors to diagnose arrhythmias. Arrhythmias are problems with the speed or rhythm of the heartbeat. The monitor is a small, portable device. You can wear one while you do your normal daily activities. This is usually used to diagnose what is causing palpitations/syncope (passing out).    Follow-Up: DR. Meda Coffee IN 1 MONTH  Any Other Special Instructions Will Be Listed Below (If Applicable).     If you need a refill on your cardiac medications before your next appointment, please call your pharmacy.       Sumner Boast, PA-C  04/23/2016 9:54 AM    [facility] [place], [place], [place]  [address] Phone: [phone]; Fax: [phone]

## 2016-04-23 NOTE — Patient Instructions (Signed)
Medication Instructions:  Your physician recommends that you continue on your current medications as directed. Please refer to the Current Medication list given to you today.   Labwork: NONE  Testing/Procedures: 1. Your physician has recommended that you wear an event monitor. Event monitors are medical devices that record the heart's electrical activity. Doctors most often [place] these monitors to diagnose arrhythmias. Arrhythmias are problems with the speed or rhythm of the heartbeat. The monitor is a small, portable device. You can wear one while you do your normal daily activities. This is usually used to diagnose what is causing palpitations/syncope (passing out).    Follow-Up: DR. Meda Coffee IN 1 MONTH  Any Other Special Instructions Will Be Listed Below (If Applicable).     If you need a refill on your cardiac medications before your next appointment, please call your pharmacy.

## 2016-05-23 ENCOUNTER — Encounter: Payer: Self-pay | Admitting: Cardiology

## 2016-05-30 ENCOUNTER — Telehealth: Payer: Self-pay | Admitting: *Deleted

## 2016-05-30 NOTE — Telephone Encounter (Signed)
Cancelled Monitor and f/u with Lenze  Received: 2 days ago  [place], LPN        I called pt about the monitor appt today she advised she did not want to have the appt for monitor and she also states she does not need f/u with Lenze since all testing came back negative. I have removed the pt order out of system for monitor.   Brenda Bennett

## 2016-05-30 NOTE — Telephone Encounter (Signed)
-----   Message from Jeanie Sewer sent at 05/28/2016  2:07 PM EDT ----- Regarding: Cancelled Monitor and f/u with Lenze I called pt about the monitor appt today she advised she did not want to have the appt for monitor and she also states she does not need f/u with Lenze since all testing came back negative. I have removed the pt order out of system for monitor.   Davy Pique

## 2016-06-07 ENCOUNTER — Ambulatory Visit: Payer: BLUE CROSS/BLUE SHIELD | Admitting: Cardiology

## 2018-04-25 ENCOUNTER — Ambulatory Visit: Payer: 59 | Admitting: Podiatry

## 2018-04-25 ENCOUNTER — Encounter: Payer: Self-pay | Admitting: Podiatry

## 2018-04-25 VITALS — BP 110/71 | HR 65 | Resp 16

## 2018-04-25 DIAGNOSIS — B351 Tinea unguium: Secondary | ICD-10-CM

## 2018-04-25 DIAGNOSIS — M722 Plantar fascial fibromatosis: Secondary | ICD-10-CM

## 2018-04-25 NOTE — Progress Notes (Signed)
   Subjective:    Patient ID: Brenda Bennett, female    DOB: [birth year], 42 y.o.   MRN: [id number]  HPI    Review of Systems  All other systems reviewed and are negative.      Objective:   Physical Exam        Assessment & Plan:

## 2018-04-25 NOTE — Patient Instructions (Addendum)

## 2018-04-28 NOTE — Progress Notes (Signed)
Subjective:   Patient ID: Brenda Bennett, female   DOB: 42 y.o.   MRN: [id number]   HPI Patient presents stating she is very active and she is lost her right nail 3 times and her second nails got moderately thickened.  Also has history of plantar fasciitis and states that the right one has started to become bothersome for her at this time.  Patient does not smoke and likes to be active   Review of Systems  All other systems reviewed and are negative.       Objective:  Physical Exam  Constitutional: She appears well-developed and well-nourished.  Cardiovascular: Intact distal pulses.  Pulmonary/Chest: Effort normal.  Musculoskeletal: Normal range of motion.  Neurological: She is alert.  Skin: Skin is warm.  Nursing note and vitals reviewed.   Neurovascular status found to be intact with muscle strength found to be adequate range of motion within normal limits.  Patient is noted to have discoloration right hallux nail for mild nature and on the right second nail that is thickened.  Patient has mild to moderate pain plantar fascial right and has had history of this off and on on both heels.  Patient does not smoke likes to be active     Assessment:  Chronic fasciitis by lateral secondary to foot structure and activity levels along with traumatized nailbed secondary to activity     Plan:  H&P conditions reviewed and at this point I have recommended night splint in order to support the arch and I gave instructions on how to use this if an acute attack were to occur.  Do not recommend treatment for nails at is very stable at this time and does not appear to be anything of issue

## 2018-09-25 ENCOUNTER — Other Ambulatory Visit: Payer: Self-pay

## 2019-09-07 ENCOUNTER — Telehealth: Payer: Self-pay

## 2019-09-07 NOTE — Telephone Encounter (Signed)
NOTES FAXED TO [facility] °

## 2019-09-22 ENCOUNTER — Ambulatory Visit: Payer: BLUE CROSS/BLUE SHIELD | Admitting: Cardiovascular Disease

## 2019-10-18 NOTE — Progress Notes (Signed)
Cardiology Office Note:   Date:  10/19/2019  NAME:  Brenda Bennett    MRN: [id number] DOB:  Jun 29, 1976   PCP:  Unk Pinto, MD  Cardiologist:  No primary care provider on file.  Electrophysiologist:  None   Referring MD: Nicholes Rough, PA-C   Chief Complaint  Patient presents with  . Shortness of Breath    History of Present Illness:   Brenda Bennett is a 44 y.o. female with a hx of irritable bowel syndrome who is being seen today for the evaluation of shortness of breath/bradycardia at the request of Unk Pinto, MD.  In mid January she was diagnosed with COVID-19 infection.  She seems to have gotten over this.  Her referral came from her primary care physician in December 2020.  She is apparently had fatigue which has coincided with heart rates in the 30-40 range.   She reports she is extremely active.  She works out 5 to 6 days/week.  This includes cardio as well as weight lifting.  She goes to the gym daily.  She reports in November and December she had 2 distinct episodes of palpitations.  She reports while working out she had symptoms of rapid heart rate sensation.  This sensation lasted for about 20 minutes.  She felt like she was going to pass out but did not.  She reports she was able to go about completing the rest of her day without any major limitations.  She also reports fatigue for about 2 to 3 months.  She noticed this since next getting.  She denies any depression or any substance abuse.  She also reports she had intermittent chest tightness that can come and go.  She reports that it happens at any time without any triggers.  She reports no alleviating factors.  She reports it is resolved without intervention.  Her EKG today demonstrates a normal EKG with heart rate 57 and RSR prime pattern.  She has no evidence of ST-T changes to suggest ischemia or prior infarction.  She reports she is worried about her family history of heart disease.  Her father died of a massive  heart attack at 46 years of age.  She has had paternal grandfather who had multiple heart attacks.  She overall is concerned.  Thyroid studies from December show TSH 1.49.  Her vitamin D was low and is being replaced.  She denies any depression.  She reports life is going well.  She is doing the best she can with coronavirus.  Surprisingly, she was diagnosed with coronavirus in mid January.  She appears to have had symptoms of chest congestion.  They resolved around 1 week ago.  She is trying to get back to normal activity.  Past Medical History: Past Medical History:  Diagnosis Date  . B12 deficiency   . Endometriosis   . Hyperbilirubinemia   . IBS (irritable bowel syndrome)   . Lymphadenopathy of head and neck 03/23/2014  . PONV (postoperative nausea and vomiting)    Transderm patch with last surg. worked "perfectly"  . Right ovarian cyst   . UTI (lower urinary tract infection)     Past Surgical History: Past Surgical History:  Procedure Laterality Date  . ABDOMINAL HYSTERECTOMY    . BILATERAL SALPINGECTOMY Bilateral 11/10/2012   Procedure: BILATERAL SALPINGECTOMY;  Surgeon: Lovenia Kim, MD;  Location: [facility];  Service: Gynecology;  Laterality: Bilateral;  . LAPAROSCOPY  2/99, 12/12   AND ABLASION  . ROBOTIC ASSISTED TOTAL HYSTERECTOMY  N/A 11/10/2012   Procedure: ROBOTIC ASSISTED TOTAL HYSTERECTOMY;  Surgeon: Lovenia Kim, MD;  Location: [facility];  Service: Gynecology;  Laterality: N/A;  3 hrs.    Current Medications: No outpatient medications have been marked as taking for the 10/19/19 encounter (Office Visit) with Geralynn Rile, MD.     Allergies:    Other, Latex, Promethazine hcl, and Adhesive [tape]   Social History: Social History   Socioeconomic History  . Marital status: Married    Spouse name: Not on file  . Number of children: 2  . Years of education: Not on file  . Highest education level: Not on file  Occupational History  . Occupation: Engineer, materials: [employer]   Tobacco Use  . Smoking status: Never Smoker  . Smokeless tobacco: Never Used  Substance and Sexual Activity  . Alcohol use: Yes    Comment: 1 DRINK EVERY 2-3 MONTHS  . Drug use: No  . Sexual activity: Not on file  Other Topics Concern  . Not on file  Social History Narrative  . Not on file   Social Determinants of Health   Financial Resource Strain:   . Difficulty of Paying Living Expenses: Not on file  Food Insecurity:   . Worried About Charity fundraiser in the Last Year: Not on file  . Ran Out of Food in the Last Year: Not on file  Transportation Needs:   . Lack of Transportation (Medical): Not on file  . Lack of Transportation (Non-Medical): Not on file  Physical Activity:   . Days of Exercise per Week: Not on file  . Minutes of Exercise per Session: Not on file  Stress:   . Feeling of Stress : Not on file  Social Connections:   . Frequency of Communication with Friends and Family: Not on file  . Frequency of Social Gatherings with Friends and Family: Not on file  . Attends Religious Services: Not on file  . Active Member of Clubs or Organizations: Not on file  . Attends Archivist Meetings: Not on file  . Marital Status: Not on file     Family History: The patient's family history includes Breast cancer in her paternal aunt; Celiac disease in her brother and son; Colon cancer in her paternal grandfather and another family member; Colon polyps in her father; Diabetes in her son; Gastric cancer in her maternal grandfather; Heart attack in her paternal grandfather; Heart attack (age of onset: 28) in her father; Heart disease in her mother; Hypertension in her brother; Other in her mother.  ROS:   All other ROS reviewed and negative. Pertinent positives noted in the HPI.     EKGs/Labs/Other Studies Reviewed:   The following studies were personally reviewed by me today:  EKG:  EKG is ordered today.  The ekg ordered  today demonstrates sinus bradycardia, heart rate 57, RSR prime pattern, normal variant noted, no acute ST-T changes, no evidence of prior, and was personally reviewed by me.   Recent Labs: No results found for requested labs within last 8760 hours.   Recent Lipid Panel    Component Value Date/Time   CHOL 164 02/02/2014 1026   TRIG 59 02/02/2014 1026   HDL 67 02/02/2014 1026   CHOLHDL 2.4 02/02/2014 1026   VLDL 12 02/02/2014 1026   LDLCALC 85 02/02/2014 1026    Physical Exam:   VS:  BP 113/73   Pulse (!) 58   Temp  99.1 F (37.3 C)   Ht 5' 3.5" (1.613 m)   Wt 154 lb 3.2 oz (69.9 kg)   LMP 10/21/2012   SpO2 100%   BMI 26.89 kg/m    Wt Readings from Last 3 Encounters:  10/19/19 154 lb 3.2 oz (69.9 kg)  04/23/16 153 lb 3.2 oz (69.5 kg)  07/14/15 157 lb (71.2 kg)    General: Well nourished, well developed, in no acute distress Heart: Atraumatic, normal size  Eyes: PEERLA, EOMI  Neck: Supple, no JVD Endocrine: No thryomegaly Cardiac: Normal S1, S2; RRR; no murmurs, rubs, or gallops Lungs: Clear to auscultation bilaterally, no wheezing, rhonchi or rales  Abd: Soft, nontender, no hepatomegaly  Ext: No edema, pulses 2+ Musculoskeletal: No deformities, BUE and BLE strength normal and equal Skin: Warm and dry, no rashes   Neuro: Alert and oriented to person, place, time, and situation, CNII-XII grossly intact, no focal deficits  Psych: Normal mood and affect   ASSESSMENT:   Brenda Bennett is a 44 y.o. female who presents for the following: 1. Fatigue, unspecified type   2. Sinus bradycardia   3. Palpitations   4. Mixed hyperlipidemia     PLAN:   1. Fatigue, unspecified type -Unclear etiology.  TSH normal in December.  1.49.  Vitamin D was low as well.  This could be related to recent coronavirus infection.  I highly doubt her sinus bradycardia is an issue.  She is extremely active working out 5 to 6 days/week.  I think her low heart rate just reflects an overall athletic  heart.  EKG just demonstrates RSR prime pattern which is normal variant.  Nonetheless, we will pursue an echocardiogram just to make sure everything is okay.  I hear no murmurs and there is no suggestion of congestive heart failure on her examination.  2. Sinus bradycardia -Likely related to maintaining a high level of athleticism. -We will obtain an echocardiogram to make sure everything is ok  3. Palpitations -She has had 2 episodes the last around 20 minutes of palpitations.  She felt like she was going to pass out but did not.  They appear to have occurred 1 time per month.  We will pursue a 30-day event monitor to exclude any significant arrhythmia.  If this is normal, and she has no symptoms during this episode we will likely pursue a cardia mobile device.  I did explain to her what this device is and gave her examples today.  4. Mixed hyperlipidemia -Given her strong family history, we will check a lipid profile.  I see none of this checked in the [facility] system.  Disposition: Return in about 3 months (around 01/16/2020).  Medication Adjustments/Labs and Tests Ordered: Current medicines are reviewed at length with the patient today.  Concerns regarding medicines are outlined above.  Orders Placed This Encounter  Procedures  . Lipid panel  . CARDIAC EVENT MONITOR  . EKG 12-Lead  . ECHOCARDIOGRAM COMPLETE   No orders of the defined types were placed in this encounter.   Patient Instructions  Medication Instructions:  The current medical regimen is effective;  continue present plan and medications.  *If you need a refill on your cardiac medications before your next appointment, please call your pharmacy*  Lab Work: Fasting lipid today. If you have labs (blood work) drawn today and your tests are completely normal, you will receive your results only by: Marland Kitchen MyChart Message (if you have MyChart) OR . A paper copy in the mail If  you have any lab test that is abnormal or we need to  change your treatment, we will call you to review the results.  Testing/Procedures: Your physician has recommended that you wear an event monitor (30 day). Event monitors are medical devices that record the heart's electrical activity. Doctors most often [place] these monitors to diagnose arrhythmias. Arrhythmias are problems with the speed or rhythm of the heartbeat. The monitor is a small, portable device. You can wear one while you do your normal daily activities. This is usually used to diagnose what is causing palpitations/syncope (passing out).  Echocardiogram - Your physician has requested that you have an echocardiogram. Echocardiography is a painless test that uses sound waves to create images of your heart. It provides your doctor with information about the size and shape of your heart and how well your heart's chambers and valves are working. This procedure takes approximately one hour. There are no restrictions for this procedure. This will be performed at our [facility] location - [address].   Follow-Up: At [facility], you and your health needs are our priority.  As part of our continuing mission to provide you with exceptional heart care, we have created designated Provider Care Teams.  These Care Teams include your primary Cardiologist (physician) and Advanced Practice Providers (APPs -  Physician Assistants and Nurse Practitioners) who all work together to provide you with the care you need, when you need it.  Your next appointment:   3 month(s)  The format for your next appointment:   Virtual Visit   Provider:   Eleonore Chiquito, MD        Signed, Addison Naegeli. Audie Box, [place]  [address] [place], [place] [address] [phone]  10/19/2019 9:26 AM

## 2019-10-19 ENCOUNTER — Ambulatory Visit: Payer: Managed Care, Other (non HMO) | Admitting: Cardiovascular Disease

## 2019-10-19 ENCOUNTER — Telehealth: Payer: Self-pay | Admitting: *Deleted

## 2019-10-19 ENCOUNTER — Other Ambulatory Visit: Payer: Self-pay

## 2019-10-19 ENCOUNTER — Encounter: Payer: Self-pay | Admitting: Cardiovascular Disease

## 2019-10-19 VITALS — BP 113/73 | HR 58 | Temp 99.1°F | Ht 63.5 in | Wt 154.2 lb

## 2019-10-19 DIAGNOSIS — R5383 Other fatigue: Secondary | ICD-10-CM

## 2019-10-19 DIAGNOSIS — E782 Mixed hyperlipidemia: Secondary | ICD-10-CM | POA: Diagnosis not present

## 2019-10-19 DIAGNOSIS — R001 Bradycardia, unspecified: Secondary | ICD-10-CM

## 2019-10-19 DIAGNOSIS — R002 Palpitations: Secondary | ICD-10-CM | POA: Diagnosis not present

## 2019-10-19 LAB — LIPID PANEL
Chol/HDL Ratio: 1.9 ratio (ref 0.0–4.4)
Cholesterol, Total: 164 mg/dL (ref 100–199)
HDL: 86 mg/dL (ref >39)
LDL Chol Calc (NIH): 67 mg/dL (ref 0–99)
Triglycerides: 54 mg/dL (ref 0–149)
VLDL Cholesterol Cal: 11 mg/dL (ref 5–40)

## 2019-10-19 NOTE — Telephone Encounter (Signed)
Patient enrolled for [employer] to ship a 30 day cardiac event monitor to her home.

## 2019-10-19 NOTE — Patient Instructions (Addendum)
Medication Instructions:  The current medical regimen is effective;  continue present plan and medications.  *If you need a refill on your cardiac medications before your next appointment, please call your pharmacy*  Lab Work: Fasting lipid today. If you have labs (blood work) drawn today and your tests are completely normal, you will receive your results only by: Marland Kitchen MyChart Message (if you have MyChart) OR . A paper copy in the mail If you have any lab test that is abnormal or we need to change your treatment, we will call you to review the results.  Testing/Procedures: Your physician has recommended that you wear an event monitor (30 day). Event monitors are medical devices that record the heart's electrical activity. Doctors most often [place] these monitors to diagnose arrhythmias. Arrhythmias are problems with the speed or rhythm of the heartbeat. The monitor is a small, portable device. You can wear one while you do your normal daily activities. This is usually used to diagnose what is causing palpitations/syncope (passing out).  Echocardiogram - Your physician has requested that you have an echocardiogram. Echocardiography is a painless test that uses sound waves to create images of your heart. It provides your doctor with information about the size and shape of your heart and how well your heart's chambers and valves are working. This procedure takes approximately one hour. There are no restrictions for this procedure. This will be performed at our [facility] location - [address].   Follow-Up: At [facility], you and your health needs are our priority.  As part of our continuing mission to provide you with exceptional heart care, we have created designated Provider Care Teams.  These Care Teams include your primary Cardiologist (physician) and Advanced Practice Providers (APPs -  Physician Assistants and Nurse Practitioners) who all work together to provide you with the care  you need, when you need it.  Your next appointment:   3 month(s)  The format for your next appointment:   Virtual Visit   Provider:   Eleonore Chiquito, MD

## 2019-10-20 ENCOUNTER — Ambulatory Visit: Payer: Self-pay | Admitting: Cardiovascular Disease

## 2019-10-28 ENCOUNTER — Telehealth (HOSPITAL_COMMUNITY): Payer: Self-pay | Admitting: Cardiovascular Disease

## 2019-10-28 NOTE — Telephone Encounter (Signed)
Left patient a message to call the office to reschedule Echo for 10/29/2019 due to [place] and the office being closed. [facility]

## 2019-10-29 ENCOUNTER — Other Ambulatory Visit (HOSPITAL_COMMUNITY): Payer: Managed Care, Other (non HMO)

## 2019-11-01 ENCOUNTER — Ambulatory Visit (INDEPENDENT_AMBULATORY_CARE_PROVIDER_SITE_OTHER): Payer: Managed Care, Other (non HMO)

## 2019-11-01 DIAGNOSIS — R002 Palpitations: Secondary | ICD-10-CM | POA: Diagnosis not present

## 2019-11-03 ENCOUNTER — Telehealth: Payer: Self-pay | Admitting: *Deleted

## 2019-11-03 ENCOUNTER — Telehealth: Payer: Self-pay | Admitting: Cardiovascular Disease

## 2019-11-03 NOTE — Telephone Encounter (Signed)
[employer] will be contacted to send out base and latex free, pediatric or sensitive skin electrodes to the patient home.

## 2019-11-03 NOTE — Telephone Encounter (Signed)
Patient calling stating she had an allergic reaction to the latex on her heart monitor. She states she had itching and blisters, and would like to know if she could try a different monitor without latex.

## 2019-11-05 NOTE — Telephone Encounter (Signed)
See other telephone note from 2/23

## 2019-11-09 ENCOUNTER — Telehealth: Payer: Self-pay | Admitting: Cardiovascular Disease

## 2019-11-09 NOTE — Telephone Encounter (Signed)
Brenda Bennett is calling stating her latex free event monitor fell off this morning. She states she put it on last night and slept through the night with it, but it came off this morning when she woke up. She would like to know what she should do in regards to this. Please advise.

## 2019-11-11 ENCOUNTER — Telehealth: Payer: Self-pay | Admitting: *Deleted

## 2019-11-11 DIAGNOSIS — R002 Palpitations: Secondary | ICD-10-CM

## 2019-11-11 NOTE — Telephone Encounter (Signed)
Pediatric electrodes fall off within 8 hours.  [employer] will be contacted to ship adult sized sensitive skin electrodes to the patients home.  Patient may try to use strip for when she runs only.

## 2019-11-13 ENCOUNTER — Ambulatory Visit (HOSPITAL_COMMUNITY): Payer: Managed Care, Other (non HMO) | Attending: Internal Medicine

## 2019-11-13 ENCOUNTER — Other Ambulatory Visit: Payer: Self-pay

## 2019-11-13 DIAGNOSIS — R002 Palpitations: Secondary | ICD-10-CM | POA: Insufficient documentation

## 2019-11-27 NOTE — Telephone Encounter (Signed)
Contacted patient- she states that she is unable to use the event monitor- Dr. Audie Box would like to have patient do a 14 day ZIO instead.  How do we go about turning the event monitor back in- Thank you for you assistance.

## 2019-12-03 ENCOUNTER — Encounter: Payer: Self-pay | Admitting: *Deleted

## 2019-12-03 NOTE — Progress Notes (Signed)
Patient ID: Brenda Bennett, female   DOB: 30-Sep-1975, 44 y.o.   MRN: [id number] Patient enrolled for [employer] to mail a 14 day ZIO XT long term holter monitor to her home.  Instructions sent to patient via My Chart message.

## 2019-12-03 NOTE — Telephone Encounter (Signed)
ZIO ordered. Mychart message sent to patient with instructions on how to send back Event monitor.  Advised patient that ZIO will be sent to her with instructions on how to place this.

## 2019-12-03 NOTE — Addendum Note (Signed)
Addended by: Caprice Beaver T on: 12/03/2019 09:55 AM   Modules accepted: Orders

## 2019-12-13 ENCOUNTER — Other Ambulatory Visit (INDEPENDENT_AMBULATORY_CARE_PROVIDER_SITE_OTHER): Payer: Managed Care, Other (non HMO)

## 2019-12-13 DIAGNOSIS — R002 Palpitations: Secondary | ICD-10-CM

## 2019-12-14 ENCOUNTER — Other Ambulatory Visit: Payer: Self-pay | Admitting: Physician Assistant

## 2020-01-01 ENCOUNTER — Telehealth: Payer: Self-pay | Admitting: Cardiovascular Disease

## 2020-01-01 NOTE — Telephone Encounter (Signed)
That will be fine by me.   [place] T. Audie Box, [place]  [address] [place], [place] [address] [phone]  5:07 PM

## 2020-01-01 NOTE — Telephone Encounter (Signed)
[facility] is requesting a Provider Switch from Dr. Audie Box to Dr. Harrington Challenger.

## 2020-01-03 NOTE — Telephone Encounter (Signed)
That is fine 

## 2020-01-18 ENCOUNTER — Telehealth: Payer: Self-pay

## 2020-01-18 NOTE — Telephone Encounter (Signed)
Called patient- advised of appointment for tomorrow 05/11- with Dr.O'Neal, patient would like to cancel due to switching to Dr.Ross.   Patient verbalized understanding.

## 2020-01-18 NOTE — Telephone Encounter (Signed)
-----   Message from Geralynn Rile, MD sent at 01/17/2020  4:30 PM EDT ----- Regarding: Scheduled 5/11 Almyra Free:  She is scheduled 5/11 but I got a message a few weeks ago she wanted to transfer to Dr. Harrington Challenger. Can you call and ask her what she would like to do?  [place] T. Audie Box, [place]  [address], [place], [place] [address] [phone]  4:31 PM

## 2020-01-19 ENCOUNTER — Telehealth: Payer: Managed Care, Other (non HMO) | Admitting: Cardiovascular Disease

## 2020-05-20 ENCOUNTER — Other Ambulatory Visit: Payer: Self-pay

## 2020-05-20 ENCOUNTER — Encounter: Payer: Self-pay | Admitting: Internal Medicine

## 2020-05-20 ENCOUNTER — Ambulatory Visit: Payer: Managed Care, Other (non HMO) | Admitting: Internal Medicine

## 2020-05-20 VITALS — BP 108/60 | HR 66 | Ht 65.0 in | Wt 159.6 lb

## 2020-05-20 DIAGNOSIS — R079 Chest pain, unspecified: Secondary | ICD-10-CM

## 2020-05-20 DIAGNOSIS — R002 Palpitations: Secondary | ICD-10-CM | POA: Diagnosis not present

## 2020-05-20 NOTE — Progress Notes (Signed)
Cardiology Office Note   Date:  05/20/2020   ID:  Brenda Bennett, DOB 09-23-1975, MRN [id number]  PCP:  Laurance Flatten, MD  Cardiologist:   Dorris Carnes, MD       History of Present Illness: Brenda Bennett is a 44 y.o. female with a history of bradycardia and palpitations.  She was previously followed by Bearl Mulberry in Feb 2021.   HR at times noted to be in 30s to 40s   Did have 2pells of palpitatons that lasted about 20 min, associated with dizziness  Also complained of intermitt chest tightness.   No triggers.   Echo was normal.  Event monitors showed no arrhythmias The pt says she remains very active  She works at a retirement facility.   Always on go   She also exercises frequently  Currently doing cross fit.   She notes no decline in her ability to do this   Able to keep up The pt has had sporadic chest pressure   Lasts about 20 min  Not associated with activity or food intake   Last spell was after work while driving   Went to urgent care    Sent home    She has not had any since  She denies dizziness  Breathing is OK    No outpatient medications have been marked as taking for the 05/20/20 encounter (Office Visit) with Fay Records, MD.     Allergies:   Other, Latex, Promethazine hcl, and Adhesive [tape]   Past Medical History:  Diagnosis Date  . B12 deficiency   . Endometriosis   . Hyperbilirubinemia   . IBS (irritable bowel syndrome)   . Lymphadenopathy of head and neck 03/23/2014  . PONV (postoperative nausea and vomiting)    Transderm patch with last surg. worked "perfectly"  . Right ovarian cyst   . UTI (lower urinary tract infection)     Past Surgical History:  Procedure Laterality Date  . ABDOMINAL HYSTERECTOMY    . BILATERAL SALPINGECTOMY Bilateral 11/10/2012   Procedure: BILATERAL SALPINGECTOMY;  Surgeon: Lovenia Kim, MD;  Location: [facility];  Service: Gynecology;  Laterality: Bilateral;  . LAPAROSCOPY  2/99, 12/12   AND ABLASION  . ROBOTIC  ASSISTED TOTAL HYSTERECTOMY N/A 11/10/2012   Procedure: ROBOTIC ASSISTED TOTAL HYSTERECTOMY;  Surgeon: Lovenia Kim, MD;  Location: [facility];  Service: Gynecology;  Laterality: N/A;  3 hrs.     Social History:  The patient  reports that she has never smoked. She has never used smokeless tobacco. She reports current alcohol use. She reports that she does not use drugs.   Family History:  The patient's family history includes Breast cancer in her paternal aunt; Celiac disease in her brother and son; Colon cancer in her paternal grandfather and another family member; Colon polyps in her father; Diabetes in her son; Gastric cancer in her maternal grandfather; Heart attack in her paternal grandfather; Heart attack (age of onset: 109) in her father; Heart disease in her mother; Hypertension in her brother; Other in her mother.    ROS:  Please see the history of present illness. All other systems are reviewed and  Negative to the above problem except as noted.    PHYSICAL EXAM: VS:  BP 108/60   Pulse 66   Ht 5\' 5"  (1.651 m)   Wt 159 lb 9.6 oz (72.4 kg)   LMP 10/21/2012   SpO2 98%   BMI 26.56 kg/m   GEN: Well  nourished, well developed, in no acute distress  HEENT: normal  Neck: no JVD, carotid bruits Cardiac: RRR; no murmurs,  No LD edema  Respiratory:  clear to auscultation bilaterally,  GI: soft, nontender, nondistended, + BS  No hepatomegaly  MS: no deformity Moving all extremities   Skin: warm and dry, no rash Neuro:  Strength and sensation are intact Psych: euthymic mood, full affect   EKG:  EKG is not ordered today.   Lipid Panel    Component Value Date/Time   CHOL 164 10/19/2019 0926   TRIG 54 10/19/2019 0926   HDL 86 10/19/2019 0926   CHOLHDL 1.9 10/19/2019 0926   CHOLHDL 2.4 02/02/2014 1026   VLDL 12 02/02/2014 1026   LDLCALC 67 10/19/2019 0926      Wt Readings from Last 3 Encounters:  05/20/20 159 lb 9.6 oz (72.4 kg)  10/19/19 154 lb 3.2 oz (69.9 kg)  04/23/16  153 lb 3.2 oz (69.5 kg)      ASSESSMENT AND PLAN: 1  Chest pain   Atypical for angina   I am not convinced it is GI The patient does not have concerns regarding medicines. Note Ca score in 2016 was 0    Lipids are excellent    I encouraged her to stay hydrated   ? If intercostal pain      2  Palpitations   Pt denies  3  Bradycardia   HR has been low   Most likely baseline and due to physical activity/training   Nothing to sugg hemodynamic compromise  Follow   4   Lipids  Excellent  LDL 67  HDL 86  Plan for f/u in 1 year    Current medicines are reviewed at length with the patient today.     Signed, Dorris Carnes, MD  05/20/2020 9:38 AM    [address], [place], [place]  [address] Phone: [phone]; Fax: [phone]

## 2020-05-20 NOTE — Patient Instructions (Signed)
Medication Instructions:  No changes *If you need a refill on your cardiac medications before your next appointment, please call your pharmacy*   Lab Work: none If you have labs (blood work) drawn today and your tests are completely normal, you will receive your results only by: . MyChart Message (if you have MyChart) OR . A paper copy in the mail If you have any lab test that is abnormal or we need to change your treatment, we will call you to review the results.   Testing/Procedures: none   Follow-Up: At [facility], you and your health needs are our priority.  As part of our continuing mission to provide you with exceptional heart care, we have created designated Provider Care Teams.  These Care Teams include your primary Cardiologist (physician) and Advanced Practice Providers (APPs -  Physician Assistants and Nurse Practitioners) who all work together to provide you with the care you need, when you need it.   Your next appointment:   12 month(s)  The format for your next appointment:   In Person  Provider:   You may see Paula Ross, MD or one of the following Advanced Practice Providers on your designated Care Team:    Scott Weaver, PA-C  Vin Bhagat, PA-C   Other Instructions   

## 2020-10-17 ENCOUNTER — Encounter: Payer: Self-pay | Admitting: Gastroenterology

## 2020-11-28 ENCOUNTER — Other Ambulatory Visit: Payer: Self-pay

## 2020-11-28 ENCOUNTER — Encounter: Payer: Self-pay | Admitting: Gastroenterology

## 2020-11-28 ENCOUNTER — Other Ambulatory Visit: Payer: Managed Care, Other (non HMO)

## 2020-11-28 ENCOUNTER — Ambulatory Visit: Payer: Managed Care, Other (non HMO) | Admitting: Gastroenterology

## 2020-11-28 VITALS — BP 112/88 | HR 66 | Ht 65.0 in | Wt 168.0 lb

## 2020-11-28 DIAGNOSIS — Z8371 Family history of colonic polyps: Secondary | ICD-10-CM

## 2020-11-28 DIAGNOSIS — R1032 Left lower quadrant pain: Secondary | ICD-10-CM

## 2020-11-28 DIAGNOSIS — R1013 Epigastric pain: Secondary | ICD-10-CM | POA: Diagnosis not present

## 2020-11-28 DIAGNOSIS — Z83719 Family history of colon polyps, unspecified: Secondary | ICD-10-CM

## 2020-11-28 DIAGNOSIS — K582 Mixed irritable bowel syndrome: Secondary | ICD-10-CM

## 2020-11-28 MED ORDER — SUTAB 1479-225-188 MG PO TABS: 1.0000 | ORAL_TABLET | ORAL | 0 refills | Status: DC

## 2020-11-28 MED ORDER — ONDANSETRON 4 MG PO TBDP: ORAL_TABLET | ORAL | 0 refills | Status: AC

## 2020-11-28 MED ORDER — IBGARD 90 MG PO CPCR: ORAL_CAPSULE | ORAL | 0 refills | Status: AC

## 2020-11-28 NOTE — Patient Instructions (Signed)
Start over the counter Colace daily for constipation. If no improvement in symptoms, start Miralax mixing 17 grams in 8 oz water/juice daily.   Your provider has requested that you go to the basement level for lab work before leaving today. Press "B" on the elevator. The lab is located at the first door on the left as you exit the elevator.  We have given you samples of IBgard for you to start taking 1-2 three times a day as needed. This can purchased over the counter.   We have sent the following medications to your pharmacy for you to pick up at your convenience: Sutab and Zofran.  You have been scheduled for a colonoscopy. Please follow written instructions given to you at your visit today.  Please pick up your prep supplies at the pharmacy within the next 1-3 days. If you use inhalers (even only as needed), please bring them with you on the day of your procedure.   Normal BMI (Body Mass Index- based on height and weight) is between 19 and 25. Your BMI today is Body mass index is 27.96 kg/m. Marland Kitchen Please consider follow up  regarding your BMI with your Primary Care Provider.  Thank you for choosing me and [facility].  Pricilla Riffle. Dagoberto Ligas., MD., Marval Regal

## 2020-11-28 NOTE — Progress Notes (Signed)
History of Present Illness: This is a 45 year old referred by Laurance Flatten,* for the evaluation of abdominal bloating, epigastric pain, left lower quadrant pain, alternating bowel habits.  She has had similar symptoms for about 20 years.  Recently epigastric pain and left lower quadrant pain have been more significant.  Her bowels alternate from mild constipation followed by several days of diarrhea.  Her son was diagnosed with celiac disease at age 34.  Her celiac testing in 2015 was negative.  Both her mother and father have had precancerous colon polyps.  She has 2 paternal second-degree relatives with colon cancer. Denies weight loss, abdominal pain, constipation, diarrhea, change in stool caliber, melena, hematochezia, nausea, vomiting, dysphagia, reflux symptoms, chest pain.    Allergies  Allergen Reactions  . Other Rash  . Latex   . Promethazine Hcl   . Adhesive [Tape] Rash   Outpatient Medications Prior to Visit  Medication Sig Dispense Refill  . Multiple Vitamin (MULTIVITAMIN) tablet Take 1 tablet by mouth daily.     No facility-administered medications prior to visit.   Past Medical History:  Diagnosis Date  . B12 deficiency   . Endometriosis   . Hyperbilirubinemia   . IBS (irritable bowel syndrome)   . Lymphadenopathy of head and neck 03/23/2014  . PONV (postoperative nausea and vomiting)    Transderm patch with last surg. worked "perfectly"  . Right ovarian cyst   . UTI (lower urinary tract infection)    Past Surgical History:  Procedure Laterality Date  . ABDOMINAL HYSTERECTOMY    . BILATERAL SALPINGECTOMY Bilateral 11/10/2012   Procedure: BILATERAL SALPINGECTOMY;  Surgeon: Lovenia Kim, MD;  Location: [facility];  Service: Gynecology;  Laterality: Bilateral;  . LAPAROSCOPY  2/99, 12/12   AND ABLASION  . ROBOTIC ASSISTED TOTAL HYSTERECTOMY N/A 11/10/2012   Procedure: ROBOTIC ASSISTED TOTAL HYSTERECTOMY;  Surgeon: Lovenia Kim, MD;  Location: [facility];   Service: Gynecology;  Laterality: N/A;  3 hrs.   Social History   Socioeconomic History  . Marital status: Married    Spouse name: Not on file  . Number of children: 2  . Years of education: Not on file  . Highest education level: Not on file  Occupational History  . Occupation: Herbalist: [employer]   Tobacco Use  . Smoking status: Never Smoker  . Smokeless tobacco: Never Used  Substance and Sexual Activity  . Alcohol use: Yes    Comment: 1 DRINK EVERY 2-3 MONTHS  . Drug use: No  . Sexual activity: Not on file  Other Topics Concern  . Not on file  Social History Narrative  . Not on file   Social Determinants of Health   Financial Resource Strain: Not on file  Food Insecurity: Not on file  Transportation Needs: Not on file  Physical Activity: Not on file  Stress: Not on file  Social Connections: Not on file   Family History  Problem Relation Age of Onset  . Colon polyps Father   . Heart attack Father 49  . Other Mother        Pacemaker for low heart rate  . Heart disease Mother   . Breast cancer Paternal Aunt   . Colon cancer Paternal Grandfather        70'S  . Heart attack Paternal Grandfather   . Celiac disease Brother   . Hypertension Brother   . Diabetes Son   . Gastric cancer Maternal  Grandfather   . Celiac disease Son   . Colon cancer Other        aunt     Review of Systems: Pertinent positive and negative review of systems were noted in the above HPI section. All other review of systems were otherwise negative.   Physical Exam: General: Well developed, well nourished, no acute distress Head: Normocephalic and atraumatic Eyes: Sclerae anicteric, EOMI Ears: Normal auditory acuity Mouth: Not examined, mask on during Covid-19 pandemic Neck: Supple, no masses or thyromegaly Lungs: Clear throughout to auscultation Heart: Regular rate and rhythm; no murmurs, rubs or bruits Abdomen: Soft, non tender and non distended. No  masses, hepatosplenomegaly or hernias noted. Normal Bowel sounds Rectal: Deferred to colonoscopy Musculoskeletal: Symmetrical with no gross deformities  Skin: No lesions on visible extremities Pulses:  Normal pulses noted Extremities: No clubbing, cyanosis, edema or deformities noted Neurological: Alert oriented x 4, grossly nonfocal Cervical Nodes:  No significant cervical adenopathy Inguinal Nodes: No significant inguinal adenopathy Psychological:  Alert and cooperative. Normal mood and affect   Assessment and Recommendations:  1. Family history of colon polyps, mother and father. Family history of colon cancer. Son with celiac disease.  Abdominal bloating, alternating bowel habits with epigastric pain and LLQ pain. Check tTG, IgA. Obtain recent blood work from PCP.  Begin IBgard 1-2 p.o. 3 times daily as needed.  Schedule colonoscopy. The risks (including bleeding, perforation, infection, missed lesions, medication reactions and possible hospitalization or surgery if complications occur), benefits, and alternatives to colonoscopy with possible biopsy and possible polypectomy were discussed with the patient and they consent to proceed.     cc: Laurance Flatten, MD No address on file

## 2020-11-29 LAB — IGA: Immunoglobulin A: 213 mg/dL (ref 47–310)

## 2020-11-29 LAB — TISSUE TRANSGLUTAMINASE, IGA: (tTG) Ab, IgA: <1 U/mL

## 2021-01-27 ENCOUNTER — Other Ambulatory Visit: Payer: Self-pay

## 2021-01-27 ENCOUNTER — Ambulatory Visit (AMBULATORY_SURGERY_CENTER): Payer: Managed Care, Other (non HMO) | Admitting: Gastroenterology

## 2021-01-27 ENCOUNTER — Encounter: Payer: Self-pay | Admitting: Gastroenterology

## 2021-01-27 VITALS — BP 128/76 | HR 50 | Temp 98.6°F | Resp 14 | Ht 65.0 in | Wt 168.0 lb

## 2021-01-27 DIAGNOSIS — K582 Mixed irritable bowel syndrome: Secondary | ICD-10-CM

## 2021-01-27 DIAGNOSIS — Z8371 Family history of colonic polyps: Secondary | ICD-10-CM | POA: Diagnosis present

## 2021-01-27 DIAGNOSIS — D125 Benign neoplasm of sigmoid colon: Secondary | ICD-10-CM

## 2021-01-27 DIAGNOSIS — Z1211 Encounter for screening for malignant neoplasm of colon: Secondary | ICD-10-CM

## 2021-01-27 DIAGNOSIS — R1013 Epigastric pain: Secondary | ICD-10-CM

## 2021-01-27 DIAGNOSIS — K635 Polyp of colon: Secondary | ICD-10-CM

## 2021-01-27 DIAGNOSIS — R1032 Left lower quadrant pain: Secondary | ICD-10-CM

## 2021-01-27 HISTORY — PX: COLONOSCOPY: SHX174

## 2021-01-27 MED ORDER — SODIUM CHLORIDE 0.9 % IV SOLN: 500.0000 mL | Freq: Once | INTRAVENOUS | Status: DC

## 2021-01-27 NOTE — Progress Notes (Signed)
Called to room to assist during endoscopic procedure.  Patient ID and intended procedure confirmed with present staff. Received instructions for my participation in the procedure from the performing physician.  

## 2021-01-27 NOTE — Progress Notes (Signed)
Report to PACU, RN, vss, BBS= Clear.  

## 2021-01-27 NOTE — Patient Instructions (Addendum)
Handout was given to your care partner on polyps. You may resume your current medications today. Await biopsy results.  May take 1-3 weeks to receive pathology results. Repeat colonoscopy in 5 years. Please call if any questions or concerns.     YOU HAD AN ENDOSCOPIC PROCEDURE TODAY AT [facility]:   Refer to the procedure report that was given to you for any specific questions about what was found during the examination.  If the procedure report does not answer your questions, please call your gastroenterologist to clarify.  If you requested that your care partner not be given the details of your procedure findings, then the procedure report has been included in a sealed envelope for you to review at your convenience later.  YOU SHOULD EXPECT: Some feelings of bloating in the abdomen. Passage of more gas than usual.  Walking can help get rid of the air that was put into your GI tract during the procedure and reduce the bloating. If you had a lower endoscopy (such as a colonoscopy or flexible sigmoidoscopy) you may notice spotting of blood in your stool or on the toilet paper. If you underwent a bowel prep for your procedure, you may not have a normal bowel movement for a few days.  Please Note:  You might notice some irritation and congestion in your nose or some drainage.  This is from the oxygen used during your procedure.  There is no need for concern and it should clear up in a day or so.  SYMPTOMS TO REPORT IMMEDIATELY:   Following lower endoscopy (colonoscopy or flexible sigmoidoscopy):  Excessive amounts of blood in the stool  Significant tenderness or worsening of abdominal pains  Swelling of the abdomen that is new, acute  Fever of 100F or higher   For urgent or emergent issues, a gastroenterologist can be reached at any hour by calling [phone]. Do not use MyChart messaging for urgent concerns.    DIET:  We do recommend a small meal at first, but then  you may proceed to your regular diet.  Drink plenty of fluids but you should avoid alcoholic beverages for 24 hours.  ACTIVITY:  You should plan to take it easy for the rest of today and you should NOT DRIVE or use heavy machinery until tomorrow (because of the sedation medicines used during the test).    FOLLOW UP: Our staff will call the number listed on your records 48-72 hours following your procedure to check on you and address any questions or concerns that you may have regarding the information given to you following your procedure. If we do not reach you, we will leave a message.  We will attempt to reach you two times.  During this call, we will ask if you have developed any symptoms of COVID 19. If you develop any symptoms (ie: fever, flu-like symptoms, shortness of breath, cough etc.) before then, please call [phone].  If you test positive for Covid 19 in the 2 weeks post procedure, please call and report this information to [place].    If any biopsies were taken you will be contacted by phone or by letter within the next 1-3 weeks.  Please call us at [phone] if you have not heard about the biopsies in 3 weeks.    SIGNATURES/CONFIDENTIALITY: You and/or your care partner have signed paperwork which will be entered into your electronic medical record.  These signatures attest to the fact that that the information above on  your After Visit Summary has been reviewed and is understood.  Full responsibility of the confidentiality of this discharge information lies with you and/or your care-partner.

## 2021-01-27 NOTE — Op Note (Signed)
[facility] Patient Name: Brenda Bennett Procedure Date: 01/27/2021 10:06 AM MRN: [id number] Endoscopist: Ladene Artist , MD Age: 45 Referring MD:  Date of Birth: Jul 11, 1976 Gender: Female Account #: [id number] Procedure:                Colonoscopy Indications:              Colon cancer screening in patient at increased                            risk: Family history of 1st-degree relative with                            colon polyps Medicines:                Monitored Anesthesia Care Procedure:                Pre-Anesthesia Assessment:                           - Prior to the procedure, a History and Physical                            was performed, and patient medications and                            allergies were reviewed. The patient's tolerance of                            previous anesthesia was also reviewed. The risks                            and benefits of the procedure and the sedation                            options and risks were discussed with the patient.                            All questions were answered, and informed consent                            was obtained. Prior Anticoagulants: The patient has                            taken no previous anticoagulant or antiplatelet                            agents. ASA Grade Assessment: II - A patient with                            mild systemic disease. After reviewing the risks                            and benefits, the patient was deemed in  satisfactory condition to undergo the procedure.                           After obtaining informed consent, the colonoscope                            was passed under direct vision. Throughout the                            procedure, the patient's blood pressure, pulse, and                            oxygen saturations were monitored continuously. The                            Olympus PFC-[id number] (#[id number]) Colonoscope was                             introduced through the anus and advanced to the the                            terminal ileum, with identification of the                            appendiceal orifice and IC valve. The ileocecal                            valve, appendiceal orifice, and rectum were                            photographed. The quality of the bowel preparation                            was good. The colonoscopy was performed without                            difficulty. The patient tolerated the procedure                            well. Scope In: 10:11:19 AM Scope Out: 10:27:58 AM Scope Withdrawal Time: 0 hours 13 minutes 46 seconds  Total Procedure Duration: 0 hours 16 minutes 39 seconds  Findings:                 The perianal and digital rectal examinations were                            normal.                           A 6 mm polyp was found in the sigmoid colon. The                            polyp was sessile. The polyp was removed with a  cold snare. Resection and retrieval were complete.                           The terminal ileum appeared normal.                           The exam was otherwise without abnormality on                            direct and retroflexion views. Complications:            No immediate complications. Estimated blood loss:                            None. Estimated Blood Loss:     Estimated blood loss: none. Impression:               - One 6 mm polyp in the sigmoid colon, removed with                            a cold snare. Resected and retrieved.                           - The examined portion of the ileum was normal.                           - The examination was otherwise normal on direct                            and retroflexion views. Recommendation:           - Repeat colonoscopy in 5 years for surveillance                            based on pathology results.                           - Patient has a  contact number available for                            emergencies. The signs and symptoms of potential                            delayed complications were discussed with the                            patient. Return to normal activities tomorrow.                            Written discharge instructions were provided to the                            patient.                           - Resume previous diet.                           -  Continue present medications.                           - Await pathology results. Ladene Artist, MD 01/27/2021 10:30:54 AM This report has been signed electronically.

## 2021-01-27 NOTE — Progress Notes (Signed)
Pt was very slow to wake up.  I stimulated her and her husband also helped stimulate pt.  After waking up she did start passing flatus.  Her Abdomin was soft.  Pt encouraged to continue to bear down and pass flatus.  Per Pt's husband she has nausea after surgery usually.  I explained propofol helps to prevent nausea and vomiting.  I asked pt to sit on the side of the bed.  She was asking her husband to text people for her.  I reminded pt to continue to pass flatus. maw

## 2021-01-27 NOTE — Progress Notes (Signed)
Pt states started taking phentermine on 5/6, stopped on 5/13, inform Josh Monday, CRNA, also pt HR is 54 and pt states it has run into the upper 3-s and 40s in the past. Josh verb understanding.  Vitals KW

## 2021-01-31 ENCOUNTER — Telehealth: Payer: Self-pay

## 2021-01-31 ENCOUNTER — Telehealth: Payer: Self-pay | Admitting: *Deleted

## 2021-01-31 NOTE — Telephone Encounter (Signed)
First attempt follow up call to pt, lm on vm 

## 2021-01-31 NOTE — Telephone Encounter (Signed)
Attempted 2nd f/u phone call. No answer. Left message.  °

## 2021-02-16 ENCOUNTER — Encounter: Payer: Self-pay | Admitting: Gastroenterology

## 2021-02-21 ENCOUNTER — Other Ambulatory Visit: Payer: Self-pay | Admitting: Nephrology

## 2021-02-21 DIAGNOSIS — R7989 Other specified abnormal findings of blood chemistry: Secondary | ICD-10-CM

## 2021-02-27 ENCOUNTER — Ambulatory Visit
Admission: RE | Admit: 2021-02-27 | Discharge: 2021-02-27 | Disposition: A | Discharge status: Home / Self Care | Payer: Managed Care, Other (non HMO) | Source: Ambulatory Visit | Attending: Nephrology | Admitting: Nephrology

## 2021-02-27 DIAGNOSIS — R7989 Other specified abnormal findings of blood chemistry: Secondary | ICD-10-CM

## 2021-03-09 ENCOUNTER — Emergency Department (HOSPITAL_COMMUNITY): Payer: Managed Care, Other (non HMO)

## 2021-03-09 ENCOUNTER — Emergency Department (HOSPITAL_COMMUNITY)
Admission: EM | Admit: 2021-03-09 | Discharge: 2021-03-09 | Disposition: A | Discharge status: Home / Self Care | Payer: Managed Care, Other (non HMO) | Attending: Emergency Medicine | Admitting: Emergency Medicine

## 2021-03-09 ENCOUNTER — Other Ambulatory Visit: Payer: Self-pay

## 2021-03-09 DIAGNOSIS — N83201 Unspecified ovarian cyst, right side: Secondary | ICD-10-CM

## 2021-03-09 DIAGNOSIS — R1031 Right lower quadrant pain: Secondary | ICD-10-CM

## 2021-03-09 DIAGNOSIS — Z91040 Latex allergy status: Secondary | ICD-10-CM | POA: Insufficient documentation

## 2021-03-09 DIAGNOSIS — N83202 Unspecified ovarian cyst, left side: Secondary | ICD-10-CM | POA: Insufficient documentation

## 2021-03-09 DIAGNOSIS — R109 Unspecified abdominal pain: Secondary | ICD-10-CM

## 2021-03-09 DIAGNOSIS — R102 Pelvic and perineal pain: Secondary | ICD-10-CM

## 2021-03-09 DIAGNOSIS — R17 Unspecified jaundice: Secondary | ICD-10-CM

## 2021-03-09 LAB — POC URINE PREG, ED: Preg Test, Ur: NEGATIVE

## 2021-03-09 LAB — URINALYSIS, ROUTINE W REFLEX MICROSCOPIC
Bacteria, UA: NONE SEEN
Bilirubin Urine: NEGATIVE
Glucose, UA: 50 mg/dL — AB
Ketones, ur: 20 mg/dL — AB
Leukocytes,Ua: NEGATIVE
Nitrite: NEGATIVE
Protein, ur: NEGATIVE mg/dL
Specific Gravity, Urine: 1.018 (ref 1.005–1.030)
pH: 6 (ref 5.0–8.0)

## 2021-03-09 LAB — CBC WITH DIFFERENTIAL/PLATELET
Abs Immature Granulocytes: 0.03 10*3/uL (ref 0.00–0.07)
Basophils Absolute: 0 10*3/uL (ref 0.0–0.1)
Basophils Relative: 0 %
Eosinophils Absolute: 0 10*3/uL (ref 0.0–0.5)
Eosinophils Relative: 0 %
HCT: 46.2 % — ABNORMAL HIGH (ref 36.0–46.0)
Hemoglobin: 15.2 g/dL — ABNORMAL HIGH (ref 12.0–15.0)
Immature Granulocytes: 0 %
Lymphocytes Relative: 12 %
Lymphs Abs: 1.1 10*3/uL (ref 0.7–4.0)
MCH: 30 pg (ref 26.0–34.0)
MCHC: 32.9 g/dL (ref 30.0–36.0)
MCV: 91.1 fL (ref 80.0–100.0)
Monocytes Absolute: 0.4 10*3/uL (ref 0.1–1.0)
Monocytes Relative: 4 %
Neutro Abs: 7.6 10*3/uL (ref 1.7–7.7)
Neutrophils Relative %: 84 %
Platelets: 195 10*3/uL (ref 150–400)
RBC: 5.07 MIL/uL (ref 3.87–5.11)
RDW: 12.8 % (ref 11.5–15.5)
WBC: 9.1 10*3/uL (ref 4.0–10.5)
nRBC: 0 % (ref 0.0–0.2)

## 2021-03-09 LAB — COMPREHENSIVE METABOLIC PANEL
ALT: 20 U/L (ref 0–44)
AST: 20 U/L (ref 15–41)
Albumin: 4.6 g/dL (ref 3.5–5.0)
Alkaline Phosphatase: 43 U/L (ref 38–126)
Anion gap: 11 (ref 5–15)
BUN: 13 mg/dL (ref 6–20)
CO2: 22 mmol/L (ref 22–32)
Calcium: 9.4 mg/dL (ref 8.9–10.3)
Chloride: 103 mmol/L (ref 98–111)
Creatinine, Ser: 0.92 mg/dL (ref 0.44–1.00)
GFR, Estimated: >60 mL/min (ref >60)
Glucose, Bld: 148 mg/dL — ABNORMAL HIGH (ref 70–99)
Potassium: 3.6 mmol/L (ref 3.5–5.1)
Sodium: 136 mmol/L (ref 135–145)
Total Bilirubin: 2.5 mg/dL — ABNORMAL HIGH (ref 0.3–1.2)
Total Protein: 7.4 g/dL (ref 6.5–8.1)

## 2021-03-09 LAB — LIPASE, BLOOD: Lipase: 24 U/L (ref 11–51)

## 2021-03-09 MED ORDER — SODIUM CHLORIDE 0.9 % IV BOLUS
1000.0000 mL | Freq: Once | INTRAVENOUS | Status: AC
  Administered 2021-03-09: 1000 mL via INTRAVENOUS

## 2021-03-09 MED ORDER — IOHEXOL 350 MG/ML SOLN
100.0000 mL | Freq: Once | INTRAVENOUS | Status: AC | PRN
  Administered 2021-03-09: 100 mL via INTRAVENOUS

## 2021-03-09 MED ORDER — HYDROCODONE-ACETAMINOPHEN 5-325 MG PO TABS: 1.0000 | ORAL_TABLET | Freq: Four times a day (QID) | ORAL | 0 refills | Status: AC | PRN

## 2021-03-09 MED ORDER — NAPROXEN 500 MG PO TABS: 500.0000 mg | ORAL_TABLET | Freq: Two times a day (BID) | ORAL | 0 refills | Status: AC

## 2021-03-09 MED ORDER — ACETAMINOPHEN 500 MG PO TABS: 1000.0000 mg | ORAL_TABLET | Freq: Once | ORAL | Status: DC

## 2021-03-09 MED ORDER — ONDANSETRON HCL 4 MG/2ML IJ SOLN
4.0000 mg | Freq: Once | INTRAMUSCULAR | Status: DC
  Filled 2021-03-09: qty 2

## 2021-03-09 MED ORDER — ONDANSETRON 4 MG PO TBDP: 4.0000 mg | ORAL_TABLET | Freq: Three times a day (TID) | ORAL | 0 refills | Status: AC | PRN

## 2021-03-09 NOTE — ED Notes (Signed)
Pt transported to CT. Offered husband a more comfortable chair, he declined.

## 2021-03-09 NOTE — ED Triage Notes (Signed)
Pt BIB EMS from home. Around 1:30am pt reports sudden onset of pain that was constant with intermittent intensity in her r flank area to r groin. Pt reports nausea but no vomiting. No reports of burning with urination.  EMS also reports pt is bradycardic in the 30-48 range - pt is supposed to be seen at [facility] today to determine cause for bradycardia.  Pt was ortho positive with EMS - drop in pressure and HR.  12 lead shows prolonged QT  EMS gave 190mcg of fentanyl - no change in HR + 150 fluid bolus  Pt has allergy to latex and phenergan  EMS also reports pt started new med for weight loss

## 2021-03-09 NOTE — ED Provider Notes (Signed)
[facility] EMERGENCY DEPARTMENT Provider Note   CSN: [id number] Arrival date & time: 03/09/21  1700     History Chief Complaint  Patient presents with   Flank Pain   Back Pain   Bradycardia    Brenda Bennett is a 45 y.o. female with a past medical history significant for IBS, endometriosis, and vitamin D deficiency who presents to the ED due to sudden onset of right flank pain that radiates to the right groin region that started early this morning around 1:30 AM. Pain radiates to right-side of low back. Patient describes pain as a sharp sensation that waxes and wanes in severity.  Husband at bedside and provided some history.  Husband notes that patient has intermittent episodes of diaphoresis over the past few hours. No fever. Patient states she feels the need to urinate; however, has difficulty urinating. Last BM yesterday which was normal. She has had a partial hysterectomy; however, no other abdominal operations. Upon EMS arrival to patient's residence, she was found to be orthostatic and given 150 NS. She was also found to be bradycardic which is a chronic issue. Patient had an appointment with [facility] this morning at [facility] for her bradycardia. Denies chest pain and shortness of breath. No history of kidney stones. Denies vaginal symptoms. Patient given 161mg fentanyl prior to arrival.   History obtained from patient and past medical records. No interpreter used during encounter.       Past Medical History:  Diagnosis Date   B12 deficiency    Endometriosis    Hyperbilirubinemia    IBS (irritable bowel syndrome)    Lymphadenopathy of head and neck 03/23/2014   PONV (postoperative nausea and vomiting)    Transderm patch with last surg. worked "perfectly"   Right ovarian cyst    UTI (lower urinary tract infection)     Patient Active Problem List   Diagnosis Date Noted   Lymphadenopathy of head and neck 03/23/2014   Sinus bradycardia 02/22/2014   Fatigue  02/22/2014   History of dysmenorrhea 11/11/2012   IRRITABLE BOWEL SYNDROME 01/27/2009   ENDOMETRIOSIS 01/27/2009   HYPERBILIRUBINEMIA 01/27/2009    Past Surgical History:  Procedure Laterality Date   ABDOMINAL HYSTERECTOMY     BILATERAL SALPINGECTOMY Bilateral 11/10/2012   Procedure: BILATERAL SALPINGECTOMY;  Surgeon: RLovenia Kim MD;  [facility];  Service: Gynecology;  Laterality: Bilateral;   COLONOSCOPY  01/27/2021   LAPAROSCOPY  2/99, 12/12   AND ABLASION   ROBOTIC ASSISTED TOTAL HYSTERECTOMY N/A 11/10/2012   Procedure: ROBOTIC ASSISTED TOTAL HYSTERECTOMY;  Surgeon: RLovenia Kim MD;  [facility];  Service: Gynecology;  Laterality: N/A;  3 hrs.     OB History   No obstetric history on file.     Family History  Problem Relation Age of Onset   Colon polyps Father    Heart attack Father 79  Other Mother        Pacemaker for low heart rate   Heart disease Mother    Breast cancer Paternal Aunt    Heart attack Paternal Grandfather    Stomach cancer Paternal Grandfather    Celiac disease Brother    Hypertension Brother    Diabetes Son    Gastric cancer Maternal Grandfather    Celiac disease Son    Colon cancer Other        aunt   Rectal cancer Neg Hx    Esophageal cancer Neg Hx     Social History  Tobacco Use   Smoking status: Never   Smokeless tobacco: Never  Vaping Use   Vaping Use: Never used  Substance Use Topics   Alcohol use: Yes    Comment: 1 DRINK EVERY 2-3 MONTHS   Drug use: No    Home Medications Prior to Admission medications   Medication Sig Start Date End Date Taking? Authorizing Provider  HYDROcodone-acetaminophen (NORCO/VICODIN) 5-325 MG tablet Take 1 tablet by mouth every 6 (six) hours as needed. 03/09/21  Yes Aberman, Caroline C, PA-C  naproxen (NAPROSYN) 500 MG tablet Take 1 tablet (500 mg total) by mouth 2 (two) times daily. 03/09/21  Yes Aberman, Caroline C, PA-C  ondansetron (ZOFRAN ODT) 4 MG disintegrating tablet Take 1  tablet (4 mg total) by mouth every 8 (eight) hours as needed for nausea or vomiting. 03/09/21  Yes Aberman, Druscilla Brownie, PA-C  metFORMIN (GLUCOPHAGE) 500 MG tablet Take 1 tablet by mouth 2 (two) times daily. Patient not taking: Reported on 01/27/2021 12/06/20   [provider]  Multiple Vitamin (MULTIVITAMIN) tablet Take 1 tablet by mouth daily.    [provider]  ondansetron (ZOFRAN ODT) 4 MG disintegrating tablet Take as directed prior to colon prep 11/28/20   Ladene Artist, MD  Peppermint Oil (IBGARD) 90 MG CPCR Take 1-2 three times a day as needed 11/28/20   Ladene Artist, MD  phentermine 15 MG capsule Take 15 mg by mouth every morning. 01/13/21   [provider]  topiramate (TOPAMAX) 25 MG tablet Take 25-50 mg by mouth at bedtime. 01/13/21   [provider]    Allergies    Other, Latex, Promethazine hcl, and Adhesive [tape]  Review of Systems   Review of Systems  Constitutional:  Positive for chills and diaphoresis. Negative for fever.  Respiratory:  Negative for shortness of breath.   Cardiovascular:  Negative for chest pain.  Gastrointestinal:  Positive for abdominal pain and nausea. Negative for diarrhea and vomiting.  Genitourinary:  Positive for difficulty urinating. Negative for dysuria.  All other systems reviewed and are negative.  Physical Exam Updated Vital Signs BP 107/73   Pulse (!) 58   Temp 97.6 F (36.4 C) (Oral)   Resp 15   Ht 5' 4" (1.626 m)   Wt 68.9 kg   LMP 10/21/2012   SpO2 99%   BMI 26.09 kg/m   Physical Exam Vitals and nursing note reviewed.  Constitutional:      General: She is not in acute distress.    Appearance: She is ill-appearing.     Comments: Appears to be uncomfortable in bed.  HENT:     Head: Normocephalic.  Eyes:     Pupils: Pupils are equal, round, and reactive to light.  Cardiovascular:     Rate and Rhythm: Normal rate and regular rhythm.     Pulses: Normal pulses.     Heart sounds: Normal  heart sounds. No murmur heard.   No friction rub. No gallop.  Pulmonary:     Effort: Pulmonary effort is normal.     Breath sounds: Normal breath sounds.  Abdominal:     General: Abdomen is flat. There is no distension.     Palpations: Abdomen is soft.     Tenderness: There is abdominal tenderness. There is guarding. There is no rebound.     Comments: Diffuse tenderness throughout abdomen with voluntary guarding.   Musculoskeletal:        General: Normal range of motion.     Cervical back:  Neck supple.     Comments: No thoracic or lumbar midline tenderness  Skin:    General: Skin is warm and dry.  Neurological:     General: No focal deficit present.     Mental Status: She is alert.  Psychiatric:        Mood and Affect: Mood normal.        Behavior: Behavior normal.    ED Results / Procedures / Treatments   Labs (all labs ordered are listed, but only abnormal results are displayed) Labs Reviewed  URINALYSIS, ROUTINE W REFLEX MICROSCOPIC - Abnormal; Notable for the following components:      Result Value   Glucose, UA 50 (*)    Hgb urine dipstick MODERATE (*)    Ketones, ur 20 (*)    All other components within normal limits  CBC WITH DIFFERENTIAL/PLATELET - Abnormal; Notable for the following components:   Hemoglobin 15.2 (*)    HCT 46.2 (*)    All other components within normal limits  COMPREHENSIVE METABOLIC PANEL - Abnormal; Notable for the following components:   Glucose, Bld 148 (*)    Total Bilirubin 2.5 (*)    All other components within normal limits  LIPASE, BLOOD  POC URINE PREG, ED    EKG EKG Interpretation  Date/Time:  Thursday March 09 2021 06:13:12 EDT Ventricular Rate:  39 PR Interval:  140 QRS Duration: 116 QT Interval:  536 QTC Calculation: 432 R Axis:   89 Text Interpretation: Sinus bradycardia Probable left ventricular hypertrophy ST elev, probable normal early repol pattern Confirmed by Randal Buba, April ([id number]) on 03/09/2021 6:35:43  AM  Radiology CT ABDOMEN PELVIS W CONTRAST  Result Date: 03/09/2021 CLINICAL DATA:  Abdominal pain, primarily right cited EXAM: CT ABDOMEN AND PELVIS WITH CONTRAST TECHNIQUE: Multidetector CT imaging of the abdomen and pelvis was performed using the standard protocol following bolus administration of intravenous contrast. CONTRAST:  138m OMNIPAQUE IOHEXOL 350 MG/ML SOLN COMPARISON:  November 19, 2012 FINDINGS: Lower chest: There is mild left base atelectasis. Lung bases otherwise are clear. Hepatobiliary: There is evident hepatic steatosis. No focal liver lesions are appreciable. Gallbladder wall is not appreciably thickened. There is no biliary duct dilatation. Pancreas: There is no pancreatic mass or inflammatory focus. Spleen: No splenic lesions are evident. Adrenals/Urinary Tract: Adrenals bilateral appear normal. There is no evident renal mass or hydronephrosis on either side. There is an extrarenal pelvis on the right, an anatomic variant. There is no appreciable renal or ureteral calculus on either side. Urinary bladder is midline with wall thickness within normal limits. Stomach/Bowel: There is no appreciable diverticular disease. No evident bowel wall thickening. No evident bowel obstruction. The terminal ileum appears normal. Appendix appears normal. No evident free air or portal venous air. Vascular/Lymphatic: There is no abdominal aortic aneurysm. No arterial vascular lesions are evident. Major venous structures appear patent. There is no evident adenopathy in the abdomen or pelvis. Reproductive: Uterus is absent. Apparent physiologic follicle noted in apparent left ovarian remnant measuring 1.4 x 1.4 cm, a finding that does not warrant additional imaging surveillance. Beyond apparent physiologic follicle, no adnexal mass is evident. Other: No evident abscess or ascites in the abdomen or pelvis. Musculoskeletal: No blastic or lytic bone lesions. No appreciable abdominal wall or intramuscular lesions.  IMPRESSION: 1. No evident bowel wall thickening or bowel obstruction. Appendix region appears normal remarkable. No evident abscess in the abdomen or pelvis. 2. No hydronephrosis on either side. No appreciable renal or ureteral calculus on either side.  Urinary bladder wall thickness normal. 3.  Hepatic steatosis. 4.  Uterus absent. Electronically Signed   By: Lowella Grip III M.D.   On: 03/09/2021 08:23   US PELVIC COMPLETE W TRANSVAGINAL AND TORSION R/O  Result Date: 03/09/2021 CLINICAL DATA:  Pelvic pain EXAM: TRANSABDOMINAL AND TRANSVAGINAL ULTRASOUND OF PELVIS DOPPLER ULTRASOUND OF OVARIES TECHNIQUE: Study was performed transabdominally to optimize pelvic field of view evaluation and transvaginally to optimize internal visceral architecture evaluation. Color and duplex Doppler ultrasound was utilized to evaluate blood flow to the ovaries. COMPARISON:  None. FINDINGS: Uterus Surgically absent. Right ovary Measurements: 4.6 x 3.3 x 2.1 cm; volume: 16.6 mL. There are probable dominant follicles in the right ovary measuring 1.7 x 1.4 x 1.6 cm and 1.2 x 1.4 x 0.7 cm. There is a hypoechoic focus within the right ovary measuring 1.1 x 1.0 x 1.1 cm. Left ovary Measurements: 4.0 x 1.8 x 4.2 cm = volume: 15.9 mL. There is a complex partially cystic mass in the left ovary measuring 2.2 x 1.4 x 2.3 cm. Pulsed Doppler evaluation of both ovaries demonstrates normal low-resistance arterial and venous waveforms. Other findings No abnormal free fluid. IMPRESSION: 1.  Uterus absent. 2. Complex partially cystic mass left ovary measuring 2.2 x 1.4 x 2.3 cm. Suspect hemorrhagic cyst as most likely etiology. Differential considerations for this lesion must include endometrioma and small ovarian neoplasm. Short-interval follow up ultrasound in 6-12 weeks is recommended, preferably during the week following the patient's normal menses. 3. Suspected hemorrhagic cyst right ovary measuring 1.1 x 1.0 x 1.1 cm. Short interval  follow-up as noted above for right ovary as well. Probable physiologic follicles also present in right ovary. 4. Low resistance waveform in each ovary. No findings indicative of ovarian torsion. Electronically Signed   By: Lowella Grip III M.D.   On: 03/09/2021 07:58    Procedures Procedures   Medications Ordered in ED Medications  sodium chloride 0.9 % bolus 1,000 mL (has no administration in time range)  ondansetron (ZOFRAN) injection 4 mg (has no administration in time range)  acetaminophen (TYLENOL) tablet 1,000 mg (has no administration in time range)  iohexol (OMNIPAQUE) 350 MG/ML injection 100 mL (100 mLs Intravenous Contrast Given 03/09/21 0803)    ED Course  I have reviewed the triage vital signs and the nursing notes.  Pertinent labs & imaging results that were available during my care of the patient were reviewed by me and considered in my medical decision making (see chart for details).  Clinical Course as of 03/09/21 1114  Thu Mar 09, 2021  0655 Called [place] to request STAT US to rule out ovarian torsion.  [CA]  0702 Preg Test, Ur: NEGATIVE [CA]  0240 Dr. Randal Buba reviewed EKG which did not show prolonged QT after adjusted for bradycardia. Zofran given for nausea. [CA]  [id number] Rectal temperature 96.10F. Bear hugger placed.  [CA]  0738 Hgb urine dipstickMarland Kitchen): MODERATE [CA]  0738 Glucose, UA(!): 50 [CA]  0738 Ketones, ur(!): 20 [CA]  0846 Reassessed patient at bedside. Patient admits to improvement in symptoms. Patient appears more comfortable in bed. [CA]    Clinical Course User Index [CA] Suzy Bouchard, PA-C   MDM Rules/Calculators/A&P                         45 year old female presents to the ED due to sudden onset of right flank pain that radiates to right groin.  No history of kidney stones. She has a  history of chronic bradycardia and had an appointment with [facility] this morning at 8 AM.  Patient denies chest pain or shortness of breath.  Upon arrival, patient  hypothermic at 96.8 rectally and bradycardic in the 40s.  Abdomen soft, nondistended with diffuse tenderness with voluntary guarding. CT abdomen to rule out emergent intraabdominal etiology. [place] to rule out ovarian torsion given right groin pain. Labs ordered. IVFs and zofran given. Lower suspicion for dissection given location of pain.   Pregnancy test negative.  Lipase normal at 24.  UA significant for moderate hematuria, glucosuria, ketonuria.  No signs of infection.  CMP significant for hyperglycemia 148 with no anion gap.  Doubt DKA.  Normal renal function.  No major electrolyte derangements.  Elevated total bilirubin at 2.5. No RUQ pain. Normal AST, ALT, and Alk phos. Appears patient had elevated total bilirubin in the past. CT abdomen personally reviewed which is negative for any acute abnormalities. Doubt appendicitis. [place] personally reviewed which demonstrates: IMPRESSION:  1.  Uterus absent.     2. Complex partially cystic mass left ovary measuring 2.2 x 1.4 x  2.3 cm. Suspect hemorrhagic cyst as most likely etiology.  Differential considerations for this lesion must include  endometrioma and small ovarian neoplasm. Short-interval follow up  ultrasound in 6-12 weeks is recommended, preferably during the week  following the patient's normal menses.     3. Suspected hemorrhagic cyst right ovary measuring 1.1 x 1.0 x 1.1  cm. Short interval follow-up as noted above for right ovary as well.  Probable physiologic follicles also present in right ovary.     4. Low resistance waveform in each ovary. No findings indicative of  ovarian torsion.   Discussed case with Dr. Murrell Redden with [facility] who notes the cysts are so small that she has low suspicion her pain is related to them. Recommends outpatient follow-up. Suspect patient possibly passed a kidney stone given resolution in pain, hematuria, and no kidney stone noted on CT images. No signs of infection on UA. No hydronephrosis. Patient's  temperature normalized while here in the ED. With no WBC, low suspicion for sepsis. Patient given IVFs and is no longer orthostatic.  Patient discharged with pain medication and Zofran. Patient stable for discharge. Strict ED precautions discussed with patient. Patient states understanding and agrees to plan. Patient discharged home in no acute distress and stable vitals  Discussed case with Dr. Dina Rich who agrees with assessment and plan.  Final Clinical Impression(s) / ED Diagnoses Final diagnoses:  Right flank pain  Bilateral ovarian cysts  Total bilirubin, elevated    Rx / DC Orders ED Discharge Orders          Ordered    HYDROcodone-acetaminophen (NORCO/VICODIN) 5-325 MG tablet  Every 6 hours PRN        03/09/21 1107    naproxen (NAPROSYN) 500 MG tablet  2 times daily        03/09/21 1107    ondansetron (ZOFRAN ODT) 4 MG disintegrating tablet  Every 8 hours PRN        03/09/21 1107             Suzy Bouchard, PA-C 03/09/21 1131    Horton, [place], DO 03/10/21 [phone]

## 2021-03-09 NOTE — Discharge Instructions (Addendum)
It was a pleasure taking care of you today.  As discussed, your ultrasound showed cyst on both your ovaries.  I spoke to OB/GYN who recommends follow-up in the office.  Please call your OB/GYN for further evaluation.  Your CT scan did not show any abnormalities. You could have passed a kidney stone or have a small kidney stone that did not show up on your scans. I am sending you home with hydrocodone, save for severe pain and take naproxen for mild to moderate pain. I am also sending you home with nausea medication.  Take as needed.  Hydrocodone can cause drowsiness so do not drive or operate machinery on the medication.  Your total bilirubin was elevated today.  Please follow-up with PCP for repeat labs in 1 week.  Return to the ER for new or worsening symptoms.

## 2021-03-09 NOTE — ED Notes (Signed)
Patient sent to ultrasound at this time. Patient's husband in the room was updated on the plane of care, he denied any needs.

## 2021-03-09 NOTE — ED Notes (Signed)
Pt reports feeling urgency to urinate but unable to due to pain

## 2022-10-29 IMAGING — US US RENAL
1 series · 14 of 25 positions shown · non-contrast
Comparison: Complete abdomen ultrasound dated 07/04/2004. Abdomen
and pelvis CT dated 11/19/2012.

CLINICAL DATA: Renal insufficiency.

EXAM:
RENAL / URINARY TRACT ULTRASOUND COMPLETE

[Series 1: us renal · 0.22mm/px · 14 of 40 slices shown]
[im 1/40]
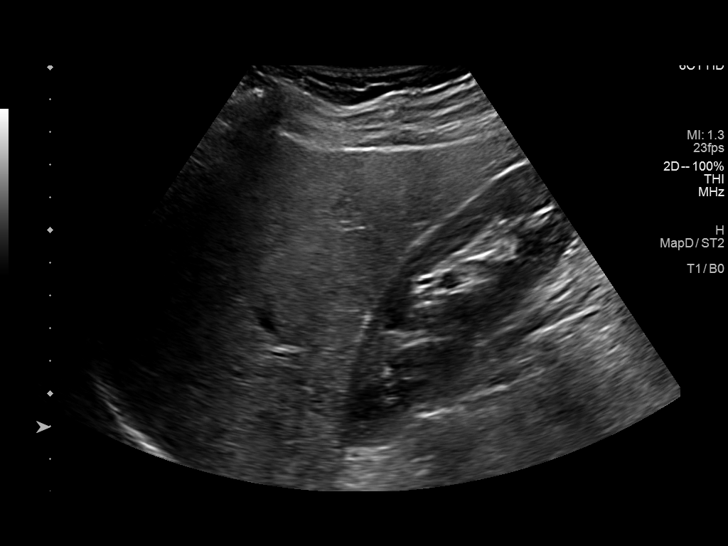
[im 4/40]
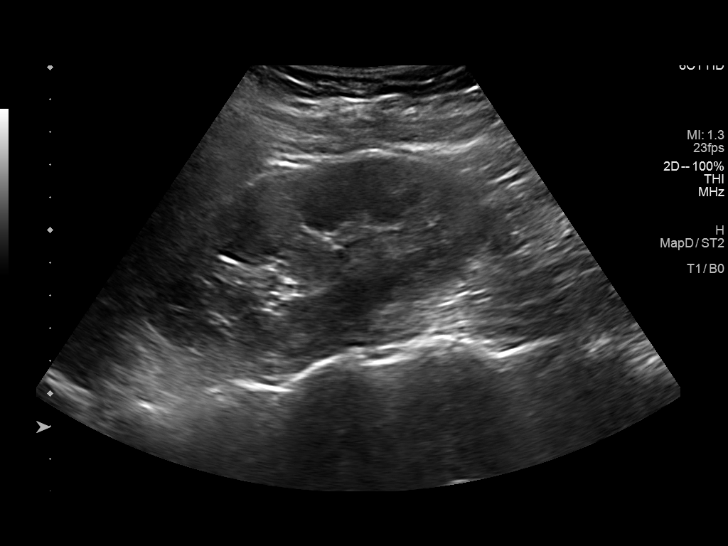
[im 7/40]
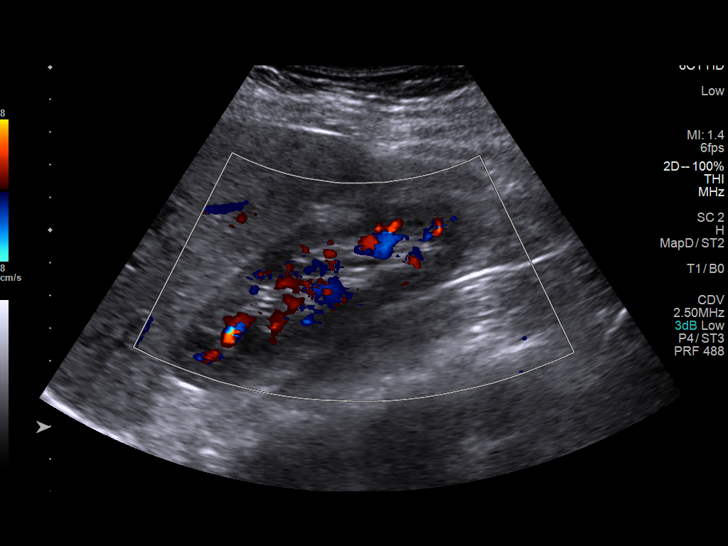
[im 10/40]
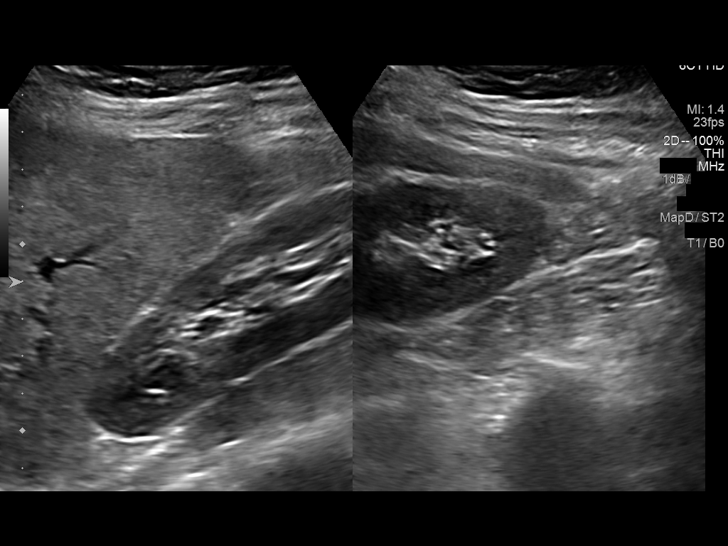
[im 14/40]
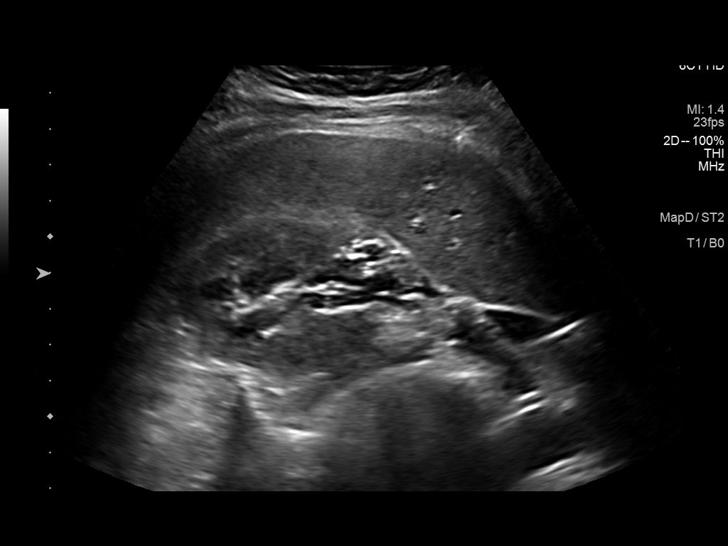
[im 15/40]
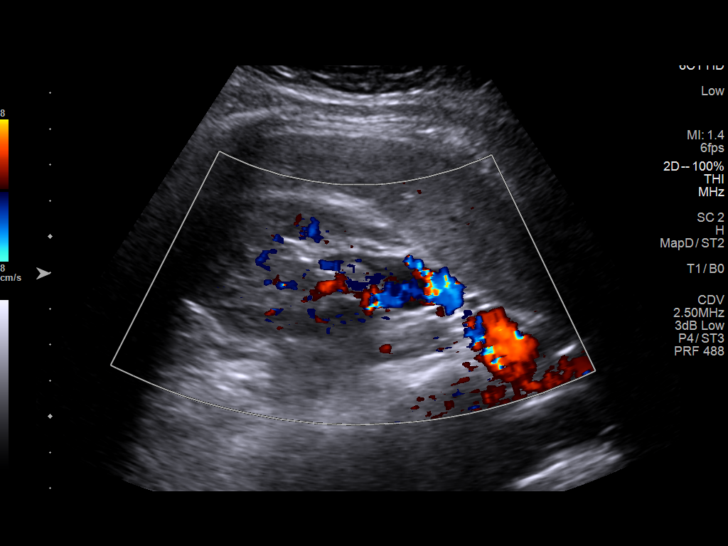
[im 18/40]
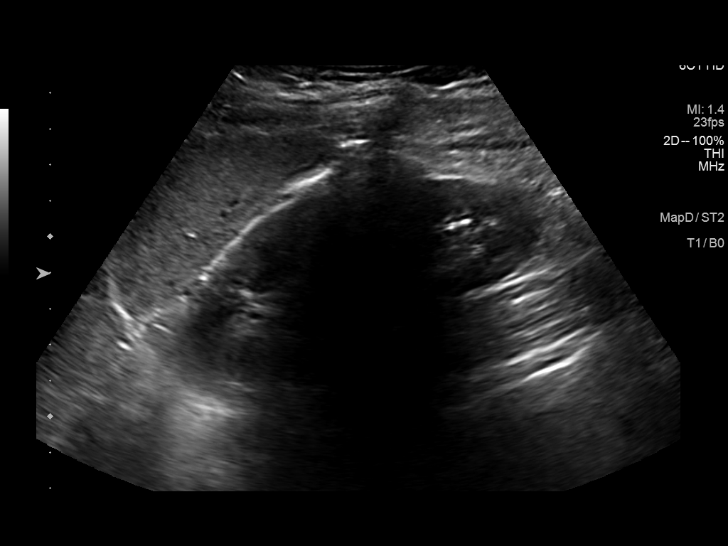
[im 22/40]
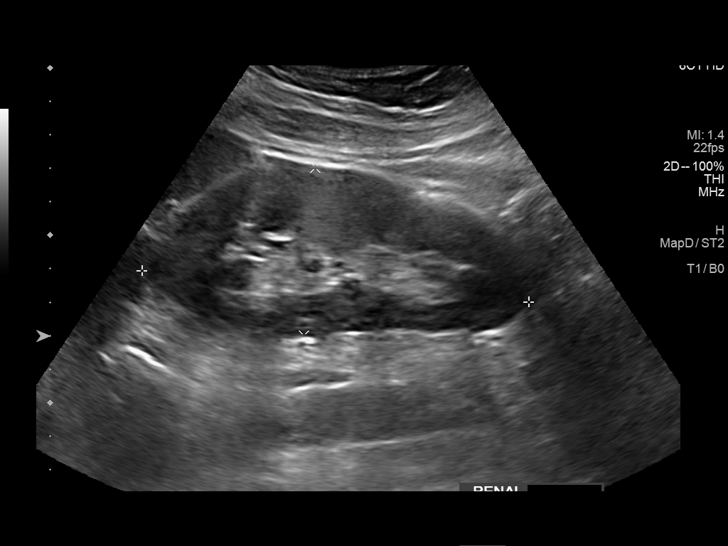
[im 25/40]
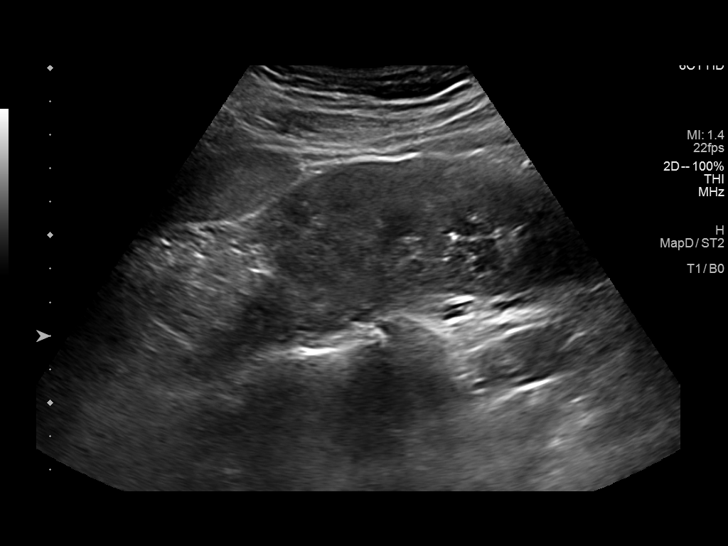
[im 27/40]
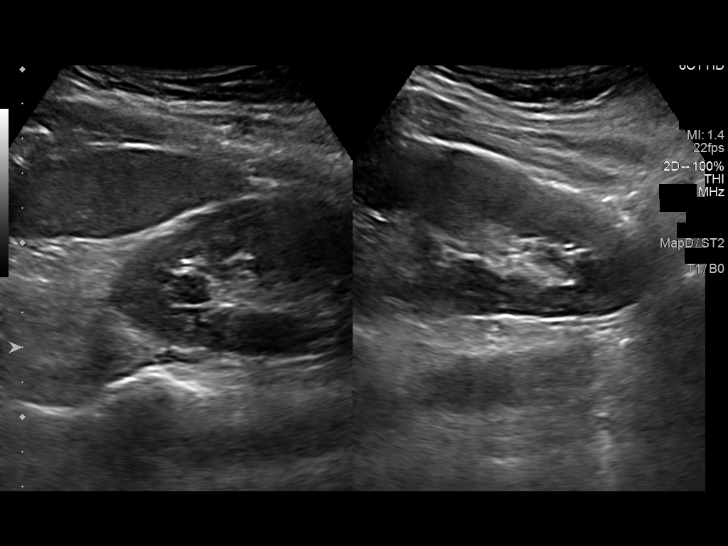
[im 30/40]
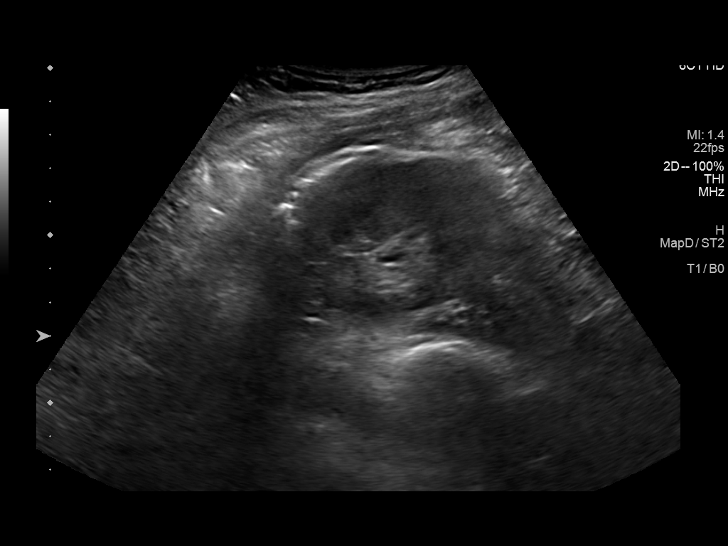
[im 33/40]
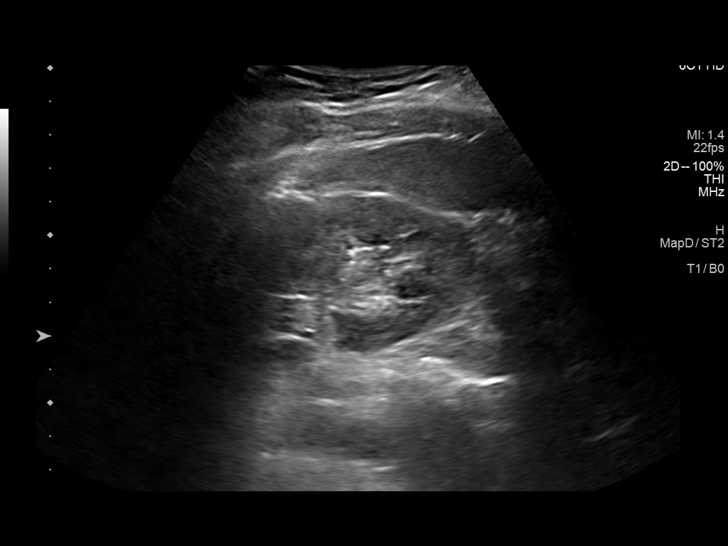
[im 36/40]
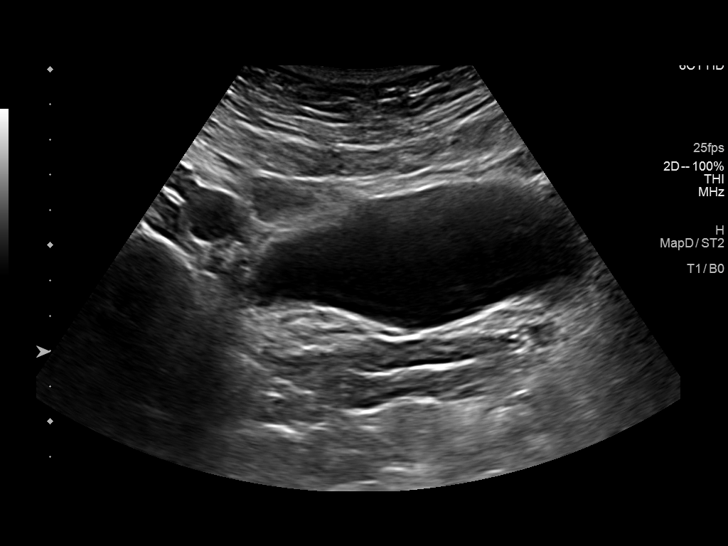
[im 40/40]
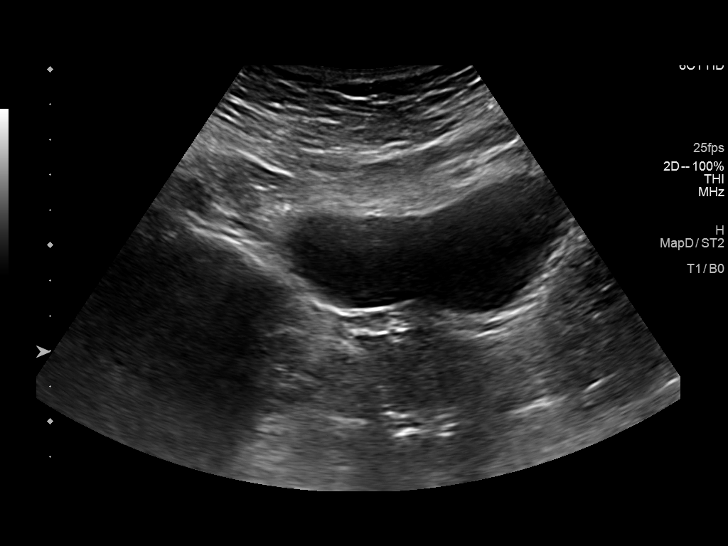

[14 of 25 positions shown; findings below may reference images not displayed]

FINDINGS: Right Kidney:

Renal measurements: 11.1 x 5.1 x 3.3 cm = volume: 98 mL.
Echogenicity within normal limits. No mass or hydronephrosis
visualized.

Left Kidney:

Renal measurements: 11.6 x 5.0 x 4.8 cm = volume: 148 mL.
Echogenicity within normal limits. No mass or hydronephrosis
visualized.

Bladder:

Appears normal for degree of bladder distention.

Other:

None.
IMPRESSION: Normal examination.

## 2022-11-08 IMAGING — US US PELVIS COMPLETE TRANSABD/TRANSVAG W DUPLEX
1 series · 13 of 25 positions shown · non-contrast
Comparison: None.

CLINICAL DATA: Pelvic pain

EXAM:
TRANSABDOMINAL AND TRANSVAGINAL ULTRASOUND OF PELVIS
DOPPLER ULTRASOUND OF OVARIES
TECHNIQUE: Study was performed transabdominally to optimize pelvic field of
view evaluation and transvaginally to optimize internal visceral
architecture evaluation.
Color and duplex Doppler ultrasound was utilized to evaluate blood
flow to the ovaries.

[Series 1: us pelvic complete w transvaginal and torsion righ · 78 acquisitions, 13 frames shown]
[im 1/78]
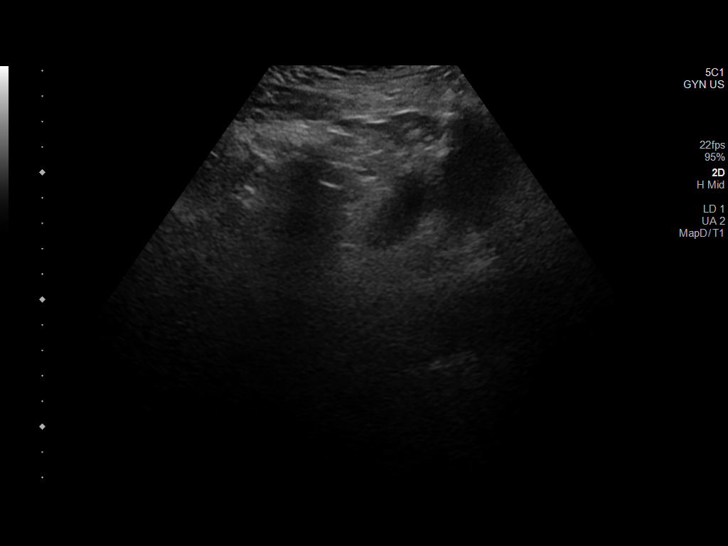
[im 7/78]
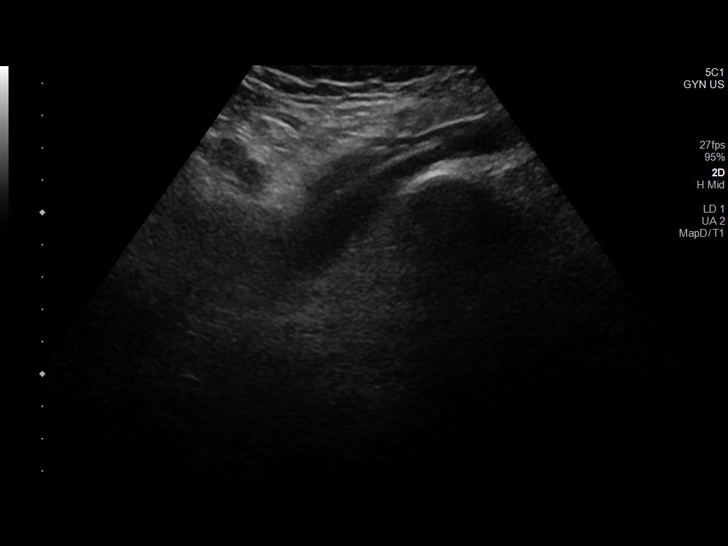
[im 13/78]
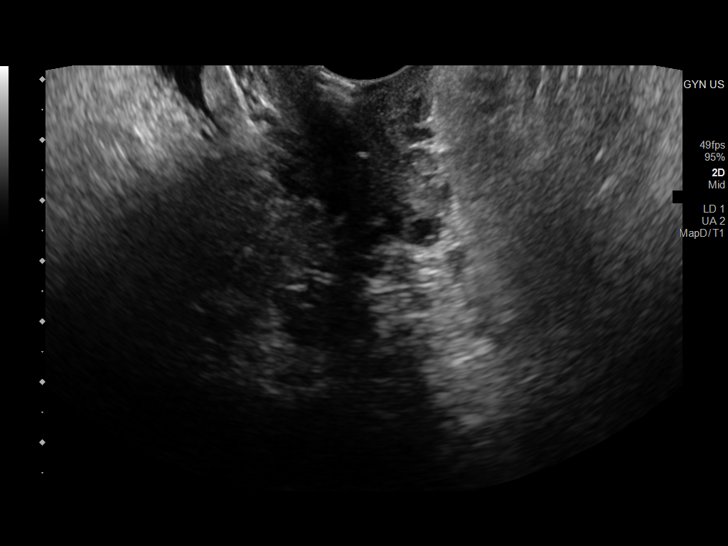
[im 20/78]
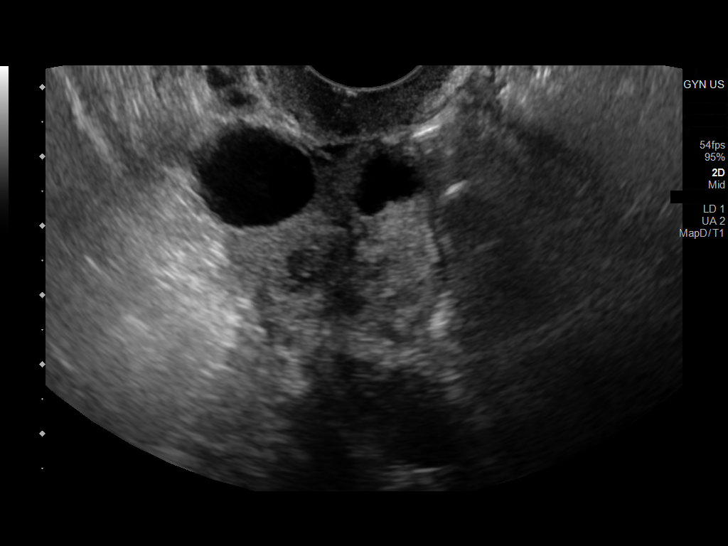
[im 26/78]
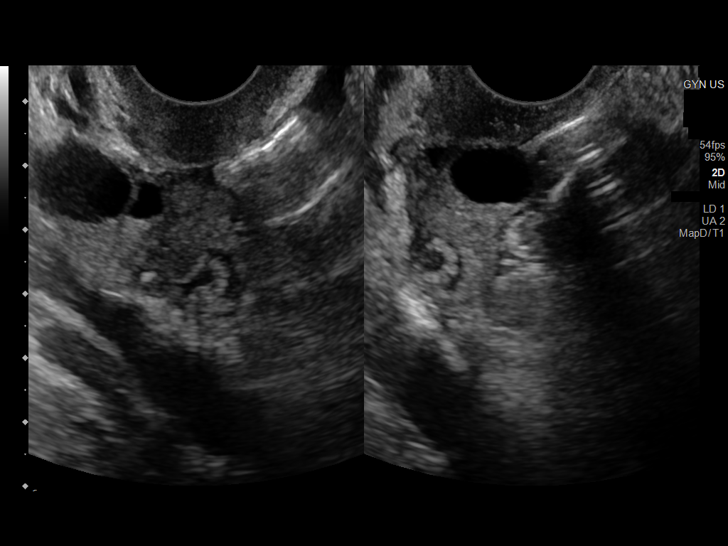
[im 33/78]
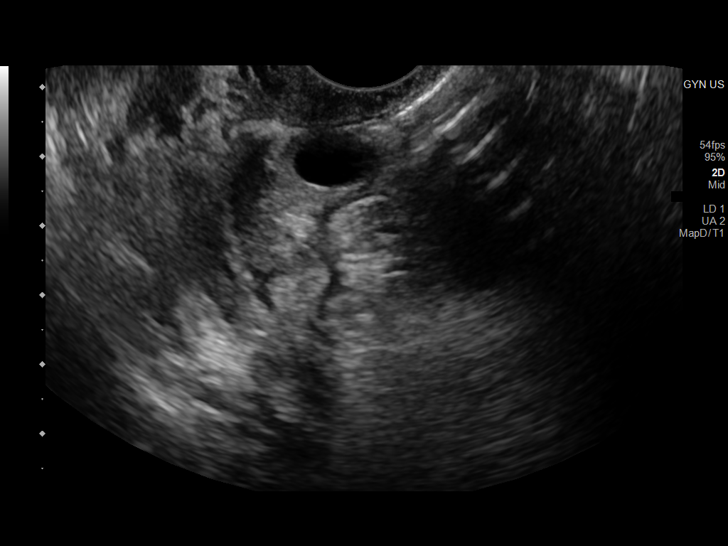
[im 39/78]
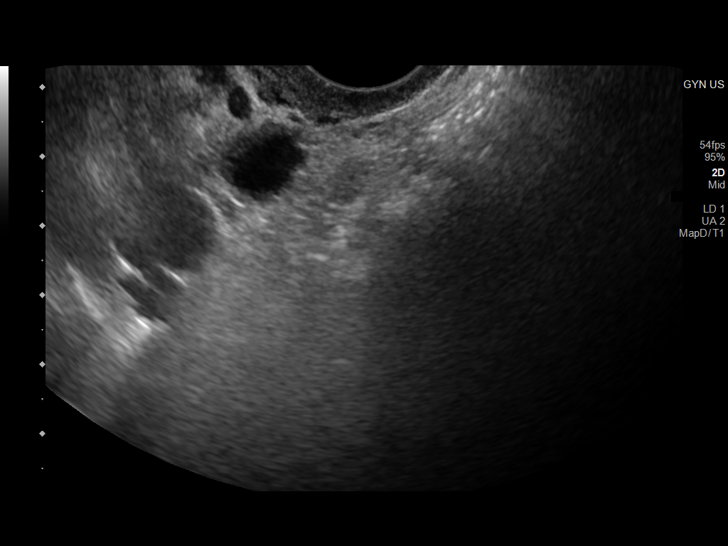
[im 45/78]
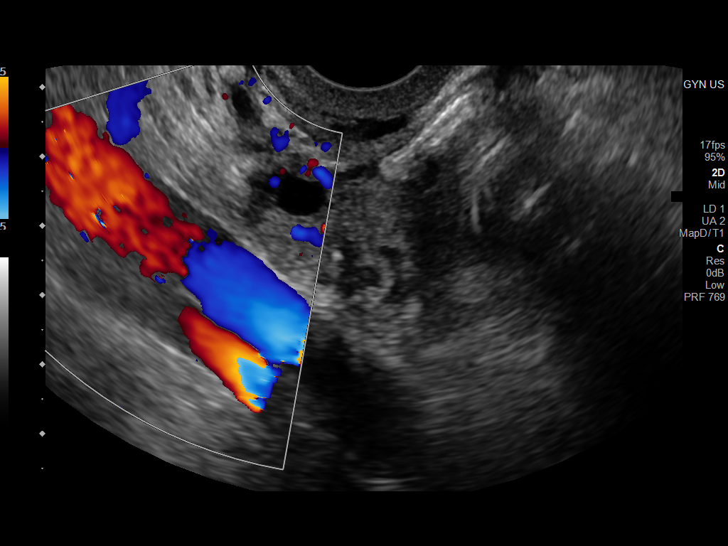
[im 52/78]
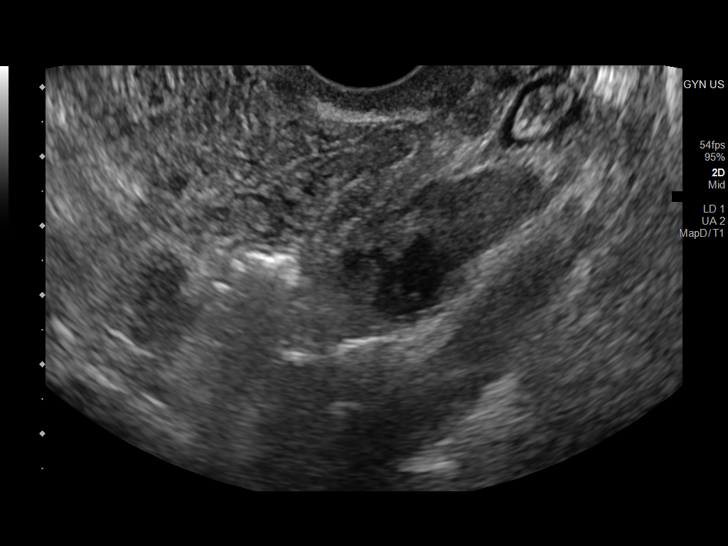
[im 58/78]
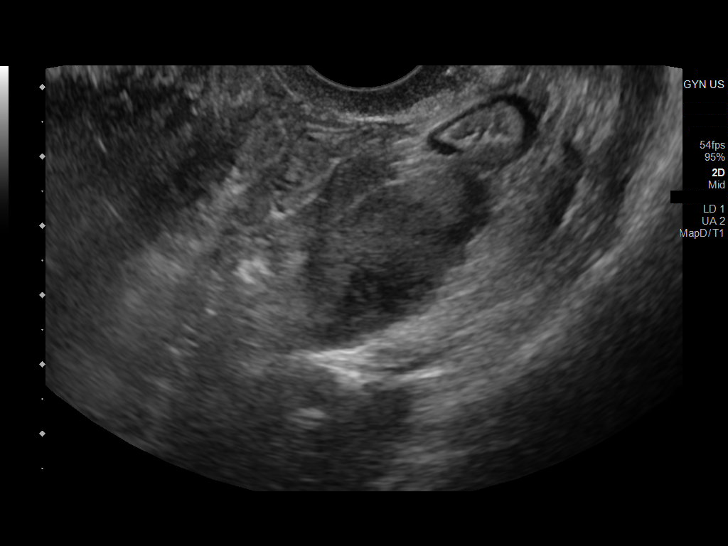
[im 65/78]
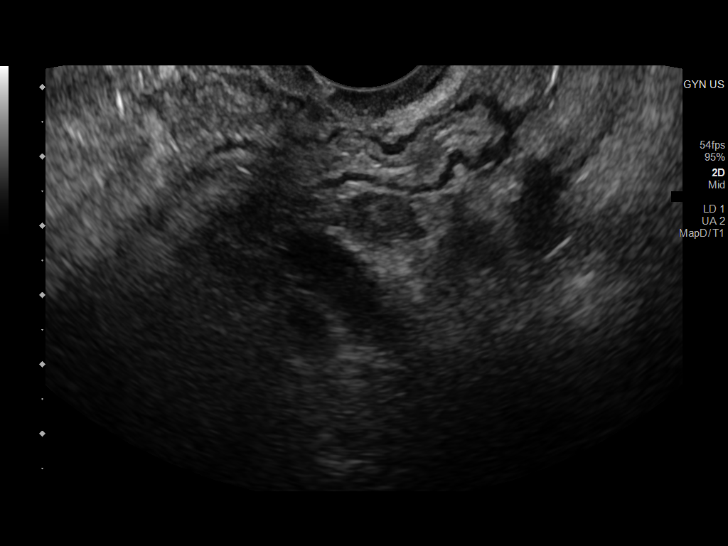
[im 71/78]
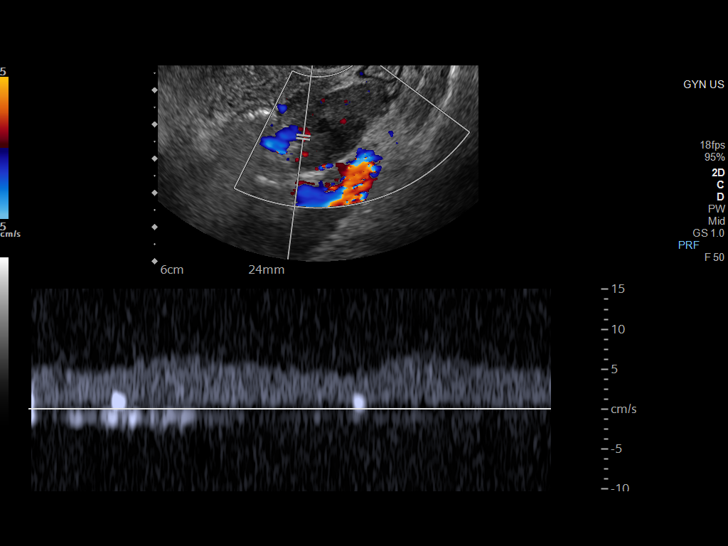
[im 78/78]
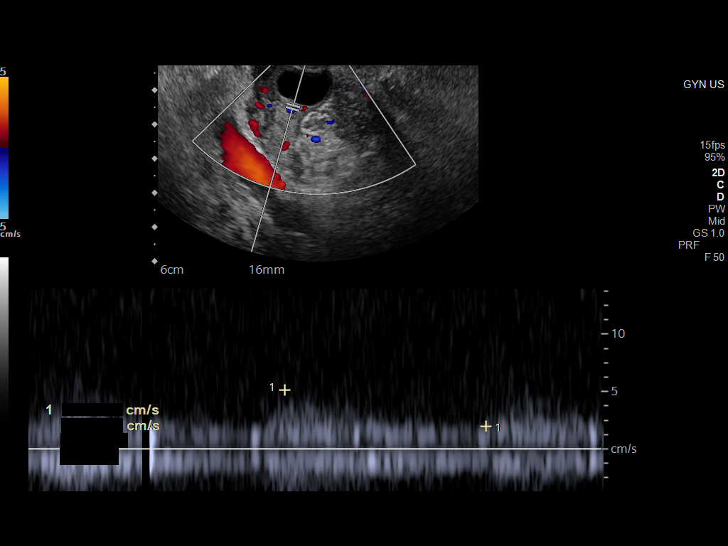

[13 of 25 positions shown; findings below may reference images not displayed]

FINDINGS: Uterus

Surgically absent.

Right ovary

Measurements: 4.6 x 3.3 x 2.1 cm; volume: 16.6 mL. There are
probable dominant follicles in the right ovary measuring 1.7 x 1.4 x
1.6 cm and 1.2 x 1.4 x 0.7 cm. There is a hypoechoic focus within
the right ovary measuring 1.1 x 1.0 x 1.1 cm.

Left ovary

Measurements: 4.0 x 1.8 x 4.2 cm = volume: 15.9 mL. There is a
complex partially cystic mass in the left ovary measuring 2.2 x
x 2.3 cm.

Pulsed Doppler evaluation of both ovaries demonstrates normal
low-resistance arterial and venous waveforms.

Other findings

No abnormal free fluid.
IMPRESSION: 1.  Uterus absent.

2. Complex partially cystic mass left ovary measuring 2.2 x 1.4 x
2.3 cm. Suspect hemorrhagic cyst as most likely etiology.
Differential considerations for this lesion must include
endometrioma and small ovarian neoplasm. Short-interval follow up
ultrasound in 6-12 weeks is recommended, preferably during the week
following the patient's normal menses.

3. Suspected hemorrhagic cyst right ovary measuring 1.1 x 1.0 x
cm. Short interval follow-up as noted above for right ovary as well.
Probable physiologic follicles also present in right ovary.

4. Low resistance waveform in each ovary. No findings indicative of
ovarian torsion.

## 2022-11-08 IMAGING — CT CT ABD-PELV W/ CM
2 of 5 series · 16 of 46 positions shown, 18 images · IV contrast (APPLIED)
Comparison: November 19, 2012

CLINICAL DATA: Abdominal pain, primarily right cited

EXAM:
CT ABDOMEN AND PELVIS WITH CONTRAST
TECHNIQUE: Multidetector CT imaging of the abdomen and pelvis was performed
using the standard protocol following bolus administration of
intravenous contrast.
CONTRAST:  100mL OMNIPAQUE IOHEXOL 350 MG/ML SOLN

[Series 3: abdomen 5.0 · axial · 0.97mm/px · z∈[-454,-49]mm · 13 of 93 slices shown, 15 images]
[im 6/93  soft-tissue]
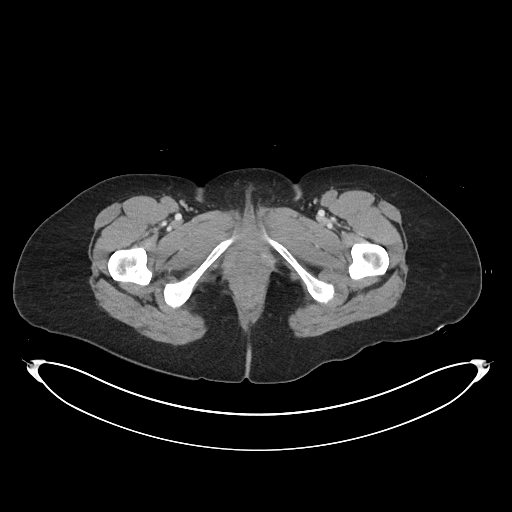
[im 6/93  bone]
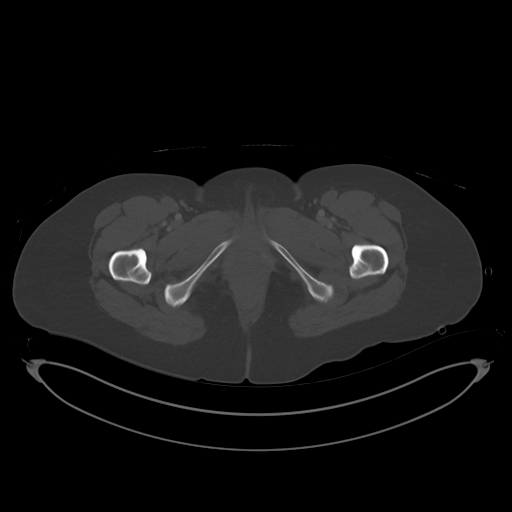
[im 12/93  soft-tissue]
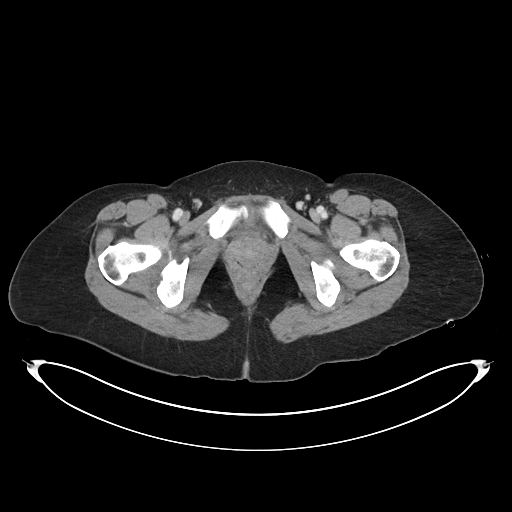
[im 18/93  soft-tissue]
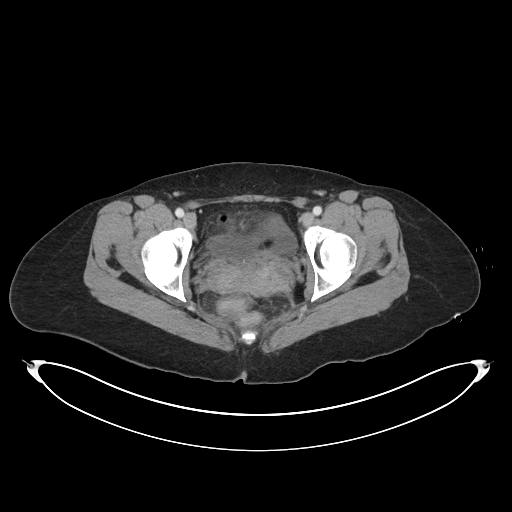
[im 29/93  soft-tissue]
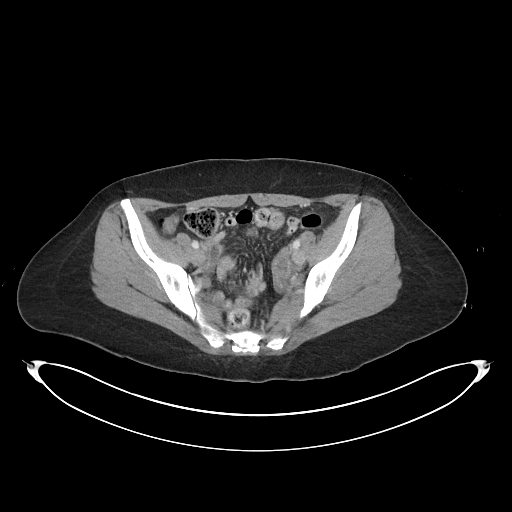
[im 35/93  soft-tissue]
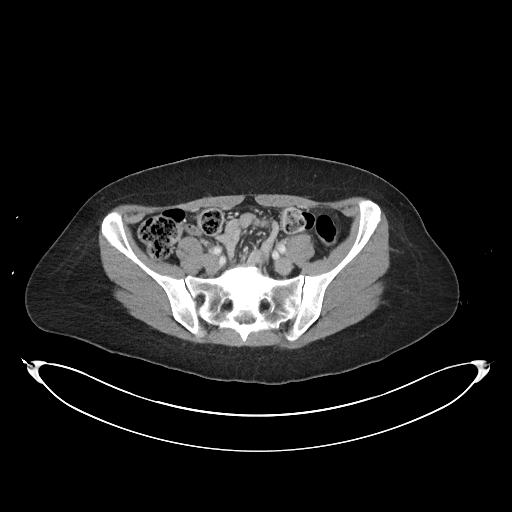
[im 41/93  soft-tissue]
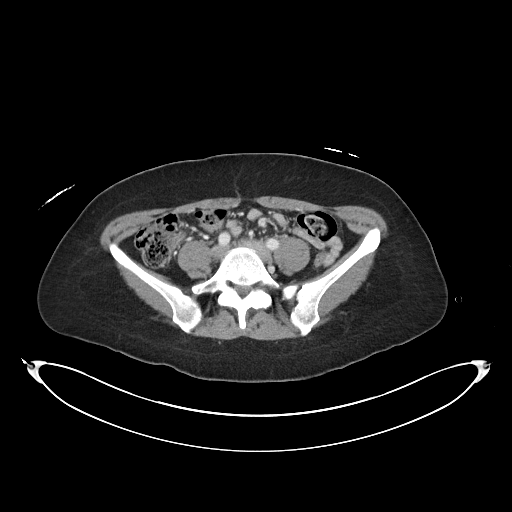
[im 47/93  soft-tissue]
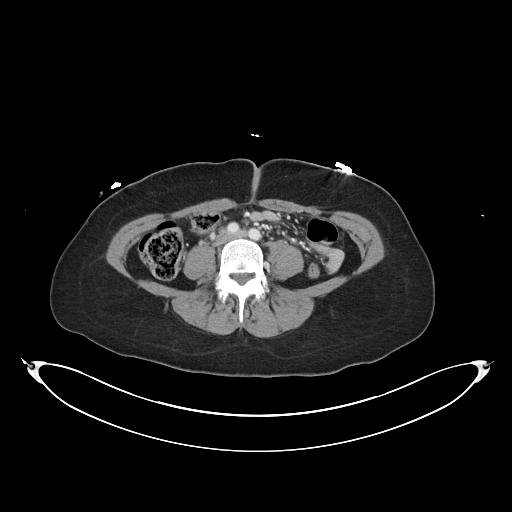
[im 52/93  soft-tissue]
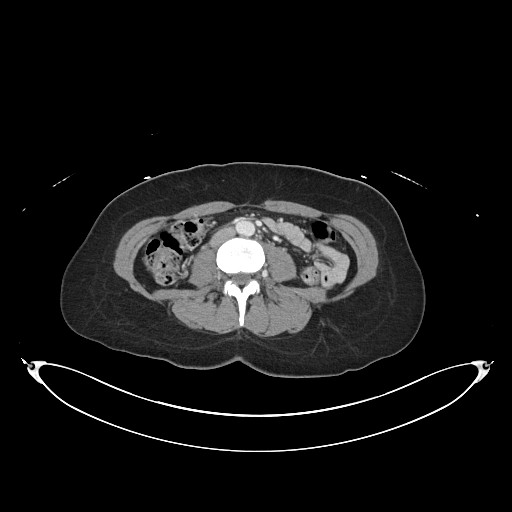
[im 58/93  soft-tissue]
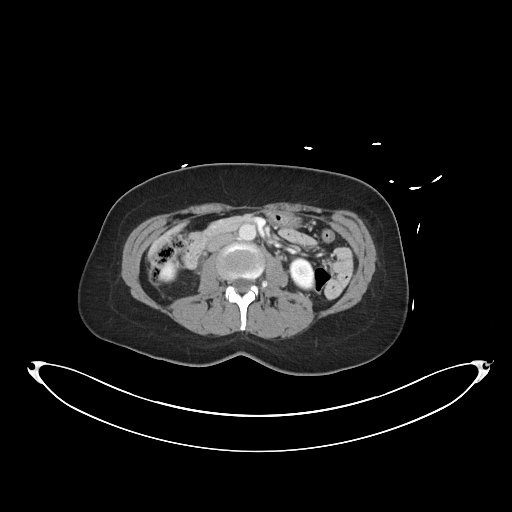
[im 58/93  bone]
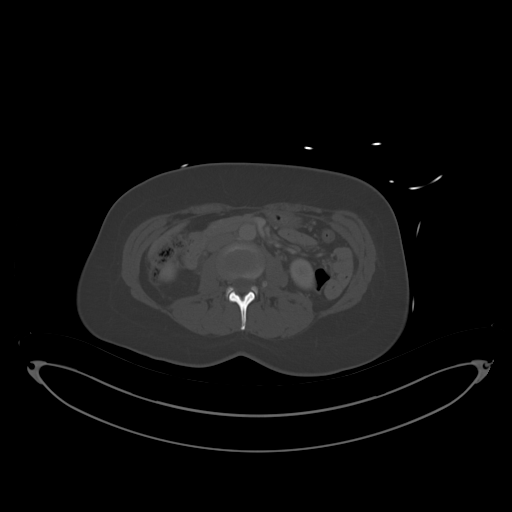
[im 64/93  soft-tissue]
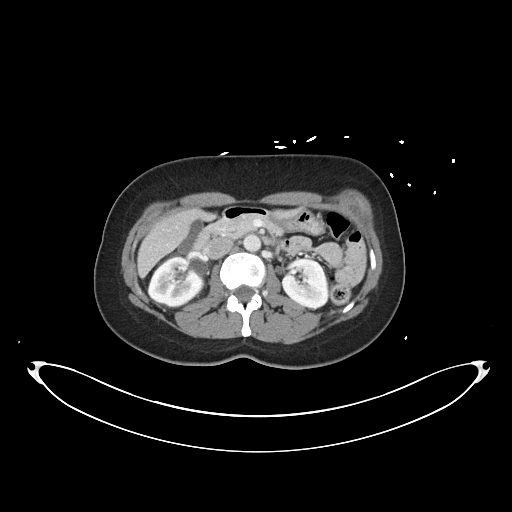
[im 75/93  soft-tissue]
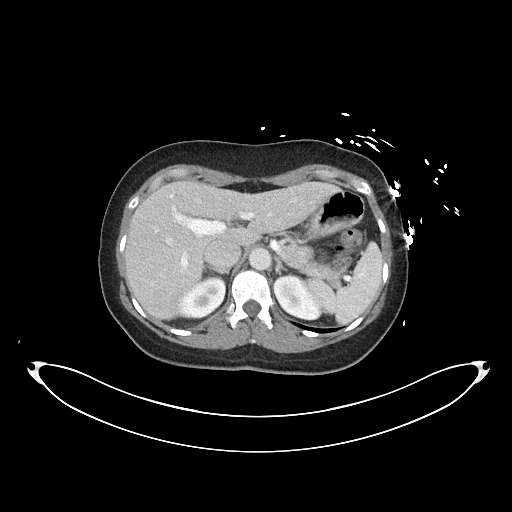
[im 81/93  soft-tissue]
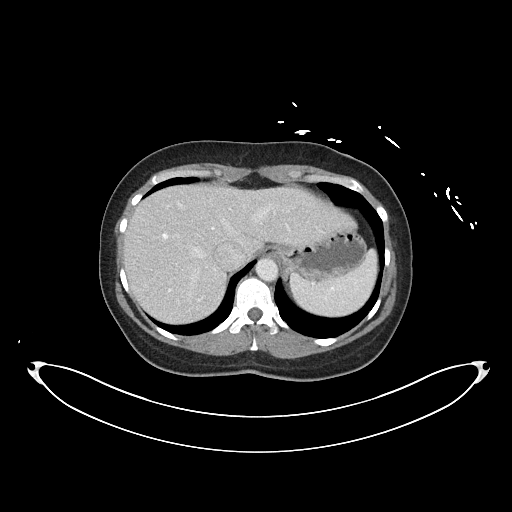
[im 87/93  soft-tissue]
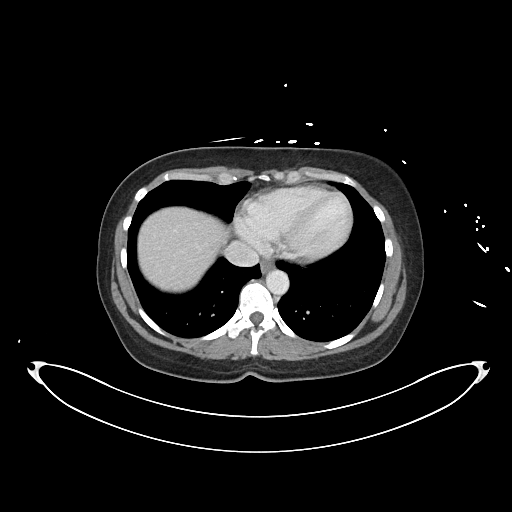

[Series 7: abdomen 3.0 mpr cor · coronal · 0.86mm/px · 3 of 77 slices shown]
[im 26/77  soft-tissue]
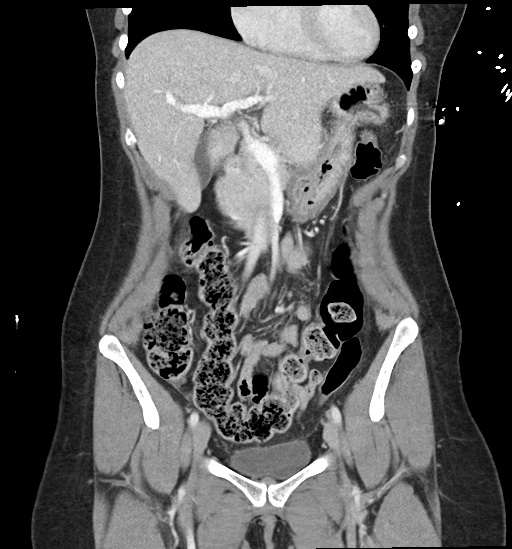
[im 34/77  soft-tissue]
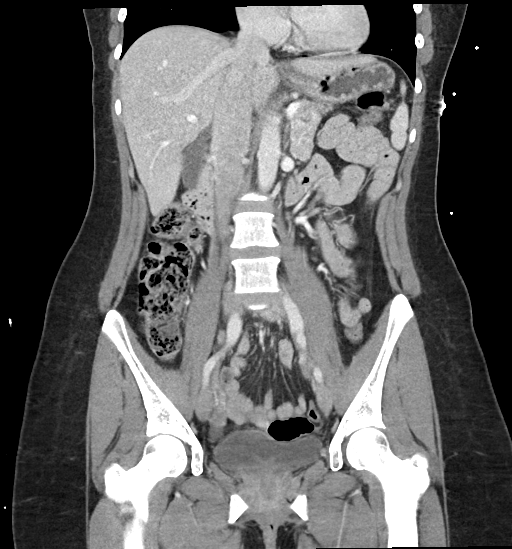
[im 43/77  soft-tissue]
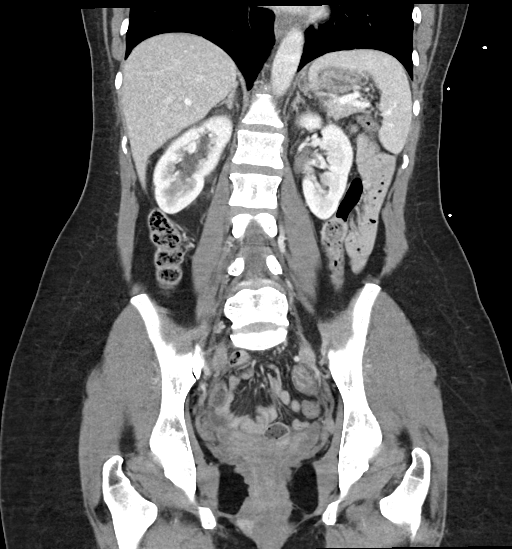

[16 of 46 positions shown; findings below may reference images not displayed]

FINDINGS: Lower chest: There is mild left base atelectasis. Lung bases
otherwise are clear.

Hepatobiliary: There is evident hepatic steatosis. No focal liver
lesions are appreciable. Gallbladder wall is not appreciably
thickened. There is no biliary duct dilatation.

Pancreas: There is no pancreatic mass or inflammatory focus.

Spleen: No splenic lesions are evident.

Adrenals/Urinary Tract: Adrenals bilateral appear normal. There is
no evident renal mass or hydronephrosis on either side. There is an
extrarenal pelvis on the right, an anatomic variant. There is no
appreciable renal or ureteral calculus on either side. Urinary
bladder is midline with wall thickness within normal limits.

Stomach/Bowel: There is no appreciable diverticular disease. No
evident bowel wall thickening. No evident bowel obstruction. The
terminal ileum appears normal. Appendix appears normal. No evident
free air or portal venous air.

Vascular/Lymphatic: There is no abdominal aortic aneurysm. No
arterial vascular lesions are evident. Major venous structures
appear patent. There is no evident adenopathy in the abdomen or
pelvis.

Reproductive: Uterus is absent. Apparent physiologic follicle noted
in apparent left ovarian remnant measuring 1.4 x 1.4 cm, a finding
that does not warrant additional imaging surveillance. Beyond
apparent physiologic follicle, no adnexal mass is evident.

Other: No evident abscess or ascites in the abdomen or pelvis.

Musculoskeletal: No blastic or lytic bone lesions. No appreciable
abdominal wall or intramuscular lesions.
IMPRESSION: 1. No evident bowel wall thickening or bowel obstruction. Appendix
region appears normal remarkable. No evident abscess in the abdomen
or pelvis.

2. No hydronephrosis on either side. No appreciable renal or
ureteral calculus on either side. Urinary bladder wall thickness
normal.

3.  Hepatic steatosis.

4.  Uterus absent.

## 2023-04-11 ENCOUNTER — Other Ambulatory Visit: Payer: Self-pay | Admitting: Neurological Surgery

## 2023-04-11 DIAGNOSIS — M4316 Spondylolisthesis, lumbar region: Secondary | ICD-10-CM

## 2023-04-15 ENCOUNTER — Other Ambulatory Visit: Payer: Self-pay | Admitting: Neurological Surgery

## 2023-04-16 ENCOUNTER — Ambulatory Visit
Admission: RE | Admit: 2023-04-16 | Discharge: 2023-04-16 | Discharge status: Home / Self Care | Payer: Managed Care, Other (non HMO) | Source: Ambulatory Visit | Attending: Neurological Surgery | Admitting: Neurological Surgery

## 2023-04-16 DIAGNOSIS — M4316 Spondylolisthesis, lumbar region: Secondary | ICD-10-CM
